# Patient Record
Sex: Male | Born: 1960
Health system: Southern US, Community
[De-identification: ages and names within clinical notes are randomized; demographics above are authoritative.]

## PROBLEM LIST (undated history)

## (undated) DIAGNOSIS — J302 Other seasonal allergic rhinitis: Secondary | ICD-10-CM

## (undated) DIAGNOSIS — K5190 Ulcerative colitis, unspecified, without complications: Secondary | ICD-10-CM

## (undated) DIAGNOSIS — J45990 Exercise induced bronchospasm: Secondary | ICD-10-CM

## (undated) DIAGNOSIS — Z8619 Personal history of other infectious and parasitic diseases: Secondary | ICD-10-CM

## (undated) DIAGNOSIS — Z87442 Personal history of urinary calculi: Secondary | ICD-10-CM

## (undated) HISTORY — DX: Exercise induced bronchospasm: J45.990

## (undated) HISTORY — DX: Other seasonal allergic rhinitis: J30.2

## (undated) HISTORY — DX: Ulcerative colitis, unspecified, without complications: K51.90

## (undated) HISTORY — PX: VASECTOMY: SHX75

## (undated) HISTORY — DX: Personal history of urinary calculi: Z87.442

## (undated) HISTORY — DX: Personal history of other infectious and parasitic diseases: Z86.19

---
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
## 1990-10-21 HISTORY — PX: PILONIDAL CYST DRAINAGE: SHX743

## 1994-10-21 DIAGNOSIS — Z87442 Personal history of urinary calculi: Secondary | ICD-10-CM

## 1994-10-21 HISTORY — DX: Personal history of urinary calculi: Z87.442

## 2008-10-21 HISTORY — PX: HERNIA REPAIR: SHX51

## 2013-12-26 DIAGNOSIS — E785 Hyperlipidemia, unspecified: Secondary | ICD-10-CM | POA: Insufficient documentation

## 2017-01-20 ENCOUNTER — Ambulatory Visit: Payer: Self-pay | Admitting: Family Medicine

## 2017-02-03 ENCOUNTER — Ambulatory Visit (INDEPENDENT_AMBULATORY_CARE_PROVIDER_SITE_OTHER): Payer: 59 | Admitting: Family Medicine

## 2017-02-03 ENCOUNTER — Encounter: Payer: Self-pay | Admitting: Family Medicine

## 2017-02-03 VITALS — BP 130/80 | HR 64 | Temp 98.2°F | Ht 68.5 in | Wt 182.5 lb

## 2017-02-03 DIAGNOSIS — E785 Hyperlipidemia, unspecified: Secondary | ICD-10-CM | POA: Diagnosis not present

## 2017-02-03 DIAGNOSIS — J45990 Exercise induced bronchospasm: Secondary | ICD-10-CM

## 2017-02-03 DIAGNOSIS — K5190 Ulcerative colitis, unspecified, without complications: Secondary | ICD-10-CM

## 2017-02-03 DIAGNOSIS — J302 Other seasonal allergic rhinitis: Secondary | ICD-10-CM | POA: Diagnosis not present

## 2017-02-03 LAB — LIPID PANEL
CHOL/HDL RATIO: 6
CHOLESTEROL: 216 mg/dL — AB (ref 0–200)
HDL: 39.2 mg/dL (ref >39.00)
LDL Cholesterol: 148 mg/dL — ABNORMAL HIGH (ref 0–99)
NonHDL: 176.99
TRIGLYCERIDES: 144 mg/dL (ref 0.0–149.0)
VLDL: 28.8 mg/dL (ref 0.0–40.0)

## 2017-02-03 LAB — COMPREHENSIVE METABOLIC PANEL
ALBUMIN: 4.2 g/dL (ref 3.5–5.2)
ALT: 43 U/L (ref 0–53)
AST: 26 U/L (ref 0–37)
Alkaline Phosphatase: 59 U/L (ref 39–117)
BILIRUBIN TOTAL: 0.7 mg/dL (ref 0.2–1.2)
BUN: 12 mg/dL (ref 6–23)
CALCIUM: 9.3 mg/dL (ref 8.4–10.5)
CHLORIDE: 105 meq/L (ref 96–112)
CO2: 30 mEq/L (ref 19–32)
CREATININE: 0.92 mg/dL (ref 0.40–1.50)
GFR: 90.84 mL/min (ref >60.00)
Glucose, Bld: 106 mg/dL — ABNORMAL HIGH (ref 70–99)
Potassium: 4.5 mEq/L (ref 3.5–5.1)
Sodium: 139 mEq/L (ref 135–145)
Total Protein: 7 g/dL (ref 6.0–8.3)

## 2017-02-03 LAB — CBC WITH DIFFERENTIAL/PLATELET
BASOS ABS: 0 10*3/uL (ref 0.0–0.1)
BASOS PCT: 0.4 % (ref 0.0–3.0)
Eosinophils Absolute: 0.1 10*3/uL (ref 0.0–0.7)
Eosinophils Relative: 2.2 % (ref 0.0–5.0)
HCT: 46.8 % (ref 39.0–52.0)
Hemoglobin: 15.8 g/dL (ref 13.0–17.0)
LYMPHS PCT: 25.8 % (ref 12.0–46.0)
Lymphs Abs: 1.2 10*3/uL (ref 0.7–4.0)
MCHC: 33.8 g/dL (ref 30.0–36.0)
MCV: 95.7 fl (ref 78.0–100.0)
MONO ABS: 0.4 10*3/uL (ref 0.1–1.0)
Monocytes Relative: 8.6 % (ref 3.0–12.0)
NEUTROS ABS: 2.8 10*3/uL (ref 1.4–7.7)
Neutrophils Relative %: 63 % (ref 43.0–77.0)
PLATELETS: 281 10*3/uL (ref 150.0–400.0)
RBC: 4.89 Mil/uL (ref 4.22–5.81)
RDW: 14 % (ref 11.5–15.5)
WBC: 4.5 10*3/uL (ref 4.0–10.5)

## 2017-02-03 MED ORDER — MESALAMINE 1.2 G PO TBEC: 1.2000 g | DELAYED_RELEASE_TABLET | Freq: Two times a day (BID) | ORAL | 3 refills | Status: DC

## 2017-02-03 MED ORDER — AZATHIOPRINE 50 MG PO TABS: 100.0000 mg | ORAL_TABLET | Freq: Every day | ORAL | 3 refills | Status: DC

## 2017-02-03 NOTE — Progress Notes (Signed)
BP 130/80   Pulse 64   Temp 98.2 F (36.8 C) (Oral)   Ht 5' 8.5" (1.74 m)   Wt 182 lb 8 oz (82.8 kg)   BMI 27.35 kg/m    CC: new pt to establish care Subjective:    Patient ID: Dustin Salazar, male    DOB: 11/27/1960, 56 y.o.   MRN: [id number]  HPI: Dustin Salazar is a 56 y.o. male presenting on 02/03/2017 for Establish Care   MD  Prior saw Dr Sela Hua at [facility] in [place].   Ulcerative colitis - on imuran and mesalamine. In remission since 1999. Followed by Dr Lupita Dawn [facility] in [facility]. Colonoscopy Q2 yrs. Requests labs today.  EIA - albuterol proair PRN.  Worsening hand arthritis pains - takes regular naprosyn.   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 Interim medical history since our last visit reviewed. Allergies and medications reviewed and updated. No outpatient prescriptions prior to visit.   No facility-administered medications prior to visit.      Per HPI unless specifically indicated in ROS section below Review of Systems     Objective:    BP 130/80   Pulse 64   Temp 98.2 F (36.8 C) (Oral)   Ht 5' 8.5" (1.74 m)   Wt 182 lb 8 oz (82.8 kg)   BMI 27.35 kg/m   Wt Readings from Last 3 Encounters:  02/03/17 182 lb 8 oz (82.8 kg)    Physical Exam  Constitutional: He appears well-developed and well-nourished. No distress.  HENT:  Mouth/Throat: Oropharynx is clear and moist. No oropharyngeal exudate.  Eyes:  Conjunctivae and EOM are normal. Pupils are equal, round, and reactive to light. No scleral icterus.  Neck: Normal range of motion. Neck supple. No thyromegaly present.  Cardiovascular: Normal rate, regular rhythm, normal heart sounds and intact distal pulses.   No murmur heard. Pulmonary/Chest: Effort normal and breath sounds normal. No respiratory distress. He has no wheezes. He has no rales.  Musculoskeletal: He exhibits no edema.  Lymphadenopathy:    He has no cervical adenopathy.  Skin: Skin is warm and dry. No rash noted.  Psychiatric: He has a normal mood and affect.  Nursing note and vitals reviewed.  No results found for this or any previous visit.    Assessment & Plan:   Problem List Items Addressed This Visit    Exercise-induced asthma    Controlled with PRN albuterol (proair)      Relevant Medications   albuterol (PROVENTIL HFA;VENTOLIN HFA) 108 (90 Base) MCG/ACT inhaler   Hyperlipidemia, unspecified    Chronic, update FLP. Not on medication.       Relevant Orders   Lipid panel   Seasonal allergic rhinitis    Reviewed zyrtec use.       Ulcerative colitis (Dustin Salazar) - Primary    Prior PCP managed medications, he sees GI Q2 yrs for colonoscopy. Planning on keeping GI in [place] at this time. In remission since imuran started. No concerns today.  Relevant Orders   Comprehensive metabolic panel   CBC with Differential/Platelet       Follow up plan: Return in about 6 months (around 08/05/2017) for annual exam, prior fasting for blood work.  Dustin Bush, MD

## 2017-02-03 NOTE — Assessment & Plan Note (Signed)
Chronic, update FLP. Not on medication.

## 2017-02-03 NOTE — Assessment & Plan Note (Signed)
Prior PCP managed medications, he sees GI Q2 yrs for colonoscopy. Planning on keeping GI in [place] at this time. In remission since imuran started. No concerns today.

## 2017-02-03 NOTE — Progress Notes (Signed)
Pre visit review using our clinic review tool, if applicable. No additional management support is needed unless otherwise documented below in the visit note. 

## 2017-02-03 NOTE — Assessment & Plan Note (Signed)
Controlled with PRN albuterol (proair)

## 2017-02-03 NOTE — Assessment & Plan Note (Signed)
Reviewed zyrtec use.

## 2017-02-03 NOTE — Patient Instructions (Addendum)
Labs today.  Medicines refilled today.  Good to meet you today, call us with questions.  Return as needed or in 6 months for physical.

## 2017-08-12 ENCOUNTER — Ambulatory Visit (INDEPENDENT_AMBULATORY_CARE_PROVIDER_SITE_OTHER): Payer: 59 | Admitting: Family Medicine

## 2017-08-12 ENCOUNTER — Encounter: Payer: Self-pay | Admitting: Family Medicine

## 2017-08-12 VITALS — BP 118/66 | HR 89 | Temp 98.2°F | Ht 68.0 in | Wt 171.0 lb

## 2017-08-12 DIAGNOSIS — E785 Hyperlipidemia, unspecified: Secondary | ICD-10-CM | POA: Diagnosis not present

## 2017-08-12 DIAGNOSIS — Z125 Encounter for screening for malignant neoplasm of prostate: Secondary | ICD-10-CM | POA: Diagnosis not present

## 2017-08-12 DIAGNOSIS — Z23 Encounter for immunization: Secondary | ICD-10-CM | POA: Diagnosis not present

## 2017-08-12 DIAGNOSIS — K5190 Ulcerative colitis, unspecified, without complications: Secondary | ICD-10-CM | POA: Diagnosis not present

## 2017-08-12 DIAGNOSIS — E041 Nontoxic single thyroid nodule: Secondary | ICD-10-CM | POA: Diagnosis not present

## 2017-08-12 DIAGNOSIS — Z0000 Encounter for general adult medical examination without abnormal findings: Secondary | ICD-10-CM | POA: Diagnosis not present

## 2017-08-12 LAB — CBC WITH DIFFERENTIAL/PLATELET
Basophils Absolute: 0 10*3/uL (ref 0.0–0.1)
Basophils Relative: 0.5 % (ref 0.0–3.0)
EOS ABS: 0.1 10*3/uL (ref 0.0–0.7)
Eosinophils Relative: 1 % (ref 0.0–5.0)
HCT: 48.8 % (ref 39.0–52.0)
HEMOGLOBIN: 16.5 g/dL (ref 13.0–17.0)
Lymphocytes Relative: 19.3 % (ref 12.0–46.0)
Lymphs Abs: 1.3 10*3/uL (ref 0.7–4.0)
MCHC: 33.9 g/dL (ref 30.0–36.0)
MCV: 96.1 fl (ref 78.0–100.0)
MONO ABS: 0.6 10*3/uL (ref 0.1–1.0)
Monocytes Relative: 8.1 % (ref 3.0–12.0)
Neutro Abs: 4.8 10*3/uL (ref 1.4–7.7)
Neutrophils Relative %: 71.1 % (ref 43.0–77.0)
Platelets: 340 10*3/uL (ref 150.0–400.0)
RBC: 5.08 Mil/uL (ref 4.22–5.81)
RDW: 13.3 % (ref 11.5–15.5)
WBC: 6.8 10*3/uL (ref 4.0–10.5)

## 2017-08-12 LAB — TSH: TSH: 1.01 u[IU]/mL (ref 0.35–4.50)

## 2017-08-12 LAB — COMPREHENSIVE METABOLIC PANEL
ALBUMIN: 4.7 g/dL (ref 3.5–5.2)
ALT: 45 U/L (ref 0–53)
AST: 31 U/L (ref 0–37)
Alkaline Phosphatase: 93 U/L (ref 39–117)
BUN: 15 mg/dL (ref 6–23)
CHLORIDE: 101 meq/L (ref 96–112)
CO2: 31 meq/L (ref 19–32)
Calcium: 9.9 mg/dL (ref 8.4–10.5)
Creatinine, Ser: 1.04 mg/dL (ref 0.40–1.50)
GFR: 78.71 mL/min (ref >60.00)
Glucose, Bld: 103 mg/dL — ABNORMAL HIGH (ref 70–99)
POTASSIUM: 5.1 meq/L (ref 3.5–5.1)
SODIUM: 138 meq/L (ref 135–145)
Total Bilirubin: 0.7 mg/dL (ref 0.2–1.2)
Total Protein: 7.5 g/dL (ref 6.0–8.3)

## 2017-08-12 LAB — LIPID PANEL
CHOL/HDL RATIO: 5
CHOLESTEROL: 209 mg/dL — AB (ref 0–200)
HDL: 43.8 mg/dL (ref >39.00)
LDL CALC: 148 mg/dL — AB (ref 0–99)
NonHDL: 165.15
Triglycerides: 85 mg/dL (ref 0.0–149.0)
VLDL: 17 mg/dL (ref 0.0–40.0)

## 2017-08-12 LAB — PSA: PSA: 0.37 ng/mL (ref 0.10–4.00)

## 2017-08-12 NOTE — Assessment & Plan Note (Signed)
R sided neck nodule palpated today. A bit high for a thyroid nodule - will check thyroid US today. No fmhx or personal history of thyroid disease.

## 2017-08-12 NOTE — Assessment & Plan Note (Signed)
Preventative protocols reviewed and updated unless pt declined. Discussed healthy diet and lifestyle.  

## 2017-08-12 NOTE — Progress Notes (Signed)
BP 118/66 (BP Location: Right Arm, Patient Position: Sitting, Cuff Size: Normal)   Pulse 89   Temp 98.2 F (36.8 C) (Oral)   Ht 5\' 8"  (1.727 m)   Wt 171 lb (77.6 kg)   SpO2 98%   BMI 26.00 kg/m    CC: CPE Subjective:    Patient ID: Dustin Salazar, male    DOB: 03/28/1961, 56 y.o.   MRN: [id number]  HPI: Clemons Salvucci is a 56 y.o. male presenting on 08/12/2017 for Annual Exam (same day labs)   IBD - controlled on imuran and lialda.  Regularly takes aleve BID for hand arthralgias (MCPs and PIPs diffusely).   Preventative: Colonoscopy 2017, Q2 yrs ([facility] at [facility]) Prostate cancer screening - discussed. Agrees to PSA, declines DRE.  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 Interim medical history since our last visit reviewed. Allergies and medications reviewed and updated. Outpatient Medications Prior to Visit  Medication Sig Dispense Refill  . albuterol (PROVENTIL HFA;VENTOLIN HFA) 108 (90 Base) MCG/ACT inhaler Inhale 1-2 puffs into the lungs as needed for wheezing or shortness of breath (Uses with exercise/running).    Marland Kitchen azaTHIOprine (IMURAN) 50 MG tablet Take 2 tablets (100 mg total) by mouth daily. 180 tablet 3  . cetirizine (ZYRTEC) 10 MG tablet Take 10 mg by mouth daily.    . Cholecalciferol (VITAMIN D3) 2000 units TABS Take 2,000 Units by mouth daily.    . mesalamine (LIALDA) 1.2 g EC tablet Take 1 tablet (1.2 g total) by mouth 2 (two) times daily. 180 tablet 3  . Naproxen Sodium 220 MG  CAPS Take 1 capsule by mouth 2 (two) times daily.     No facility-administered medications prior to visit.      Per HPI unless specifically indicated in ROS section below Review of Systems  Constitutional: Negative for activity change, appetite change, chills, fatigue, fever and unexpected weight change.  HENT: Negative for hearing loss.   Eyes: Negative for visual disturbance.  Respiratory: Negative for cough, chest tightness, shortness of breath and wheezing.   Cardiovascular: Negative for chest pain, palpitations and leg swelling.  Gastrointestinal: Negative for abdominal distention, abdominal pain, blood in stool, constipation, diarrhea, nausea and vomiting.  Genitourinary: Negative for difficulty urinating and hematuria.  Musculoskeletal: Negative for arthralgias, myalgias and neck pain.  Skin: Negative for rash.  Neurological: Negative for dizziness, seizures, syncope and headaches.  Hematological: Negative for adenopathy. Does not bruise/bleed easily.  Psychiatric/Behavioral: Negative for dysphoric mood. The patient is not nervous/anxious.        Objective:    BP 118/66 (BP Location: Right Arm, Patient Position: Sitting, Cuff Size: Normal)   Pulse 89   Temp 98.2 F (36.8 C) (Oral)   Ht 5\' 8"  (1.727 m)   Wt 171 lb (77.6 kg)   SpO2 98%   BMI 26.00 kg/m   Wt Readings from Last 3 Encounters:  08/12/17 171 lb (77.6 kg)  02/03/17 182 lb 8 oz (82.8 kg)    Physical Exam  Constitutional: He is oriented to person, place, and time. He  appears well-developed and well-nourished. No distress.  HENT:  Head: Normocephalic and atraumatic.  Right Ear: Hearing, tympanic membrane, external ear and ear canal normal.  Left Ear: Hearing, tympanic membrane, external ear and ear canal normal.  Nose: Nose normal.  Mouth/Throat: Uvula is midline, oropharynx is clear and moist and mucous membranes are normal. No oropharyngeal exudate, posterior oropharyngeal edema or posterior oropharyngeal  erythema.  Eyes: Pupils are equal, round, and reactive to light. Conjunctivae and EOM are normal. No scleral icterus.  Neck: Normal range of motion. Neck supple. No thyromegaly (R thyroid nodule) present.  Cardiovascular: Normal rate, regular rhythm, normal heart sounds and intact distal pulses.   No murmur heard. Pulses:      Radial pulses are 2+ on the right side, and 2+ on the left side.  Pulmonary/Chest: Effort normal and breath sounds normal. No respiratory distress. He has no wheezes. He has no rales.  Abdominal: Soft. Bowel sounds are normal. He exhibits no distension and no mass. There is no tenderness. There is no rebound and no guarding.  Musculoskeletal: Normal range of motion. He exhibits no edema.  Lymphadenopathy:    He has no cervical adenopathy.  Neurological: He is alert and oriented to person, place, and time.  CN grossly intact, station and gait intact  Skin: Skin is warm and dry. No rash noted.  Psychiatric: He has a normal mood and affect. His behavior is normal. Judgment and thought content normal.  Nursing note and vitals reviewed.  Results for orders placed or performed in visit on 02/03/17  Lipid panel  Result Value Ref Range   Cholesterol 216 (H) 0 - 200 mg/dL   Triglycerides 144.0 0.0 - 149.0 mg/dL   HDL 39.20 >39.00 mg/dL   VLDL 28.8 0.0 - 40.0 mg/dL   LDL Cholesterol 148 (H) 0 - 99 mg/dL   Total CHOL/HDL Ratio 6    NonHDL 176.99   Comprehensive metabolic panel  Result Value Ref Range   Sodium 139 135 - 145 mEq/L   Potassium 4.5 3.5 - 5.1 mEq/L   Chloride 105 96 - 112 mEq/L   CO2 30 19 - 32 mEq/L   Glucose, Bld 106 (H) 70 - 99 mg/dL   BUN 12 6 - 23 mg/dL   Creatinine, Ser 0.92 0.40 - 1.50 mg/dL   Total Bilirubin 0.7 0.2 - 1.2 mg/dL   Alkaline Phosphatase 59 39 - 117 U/L   AST 26 0 - 37 U/L   ALT 43 0 - 53 U/L   Total Protein 7.0 6.0 - 8.3 g/dL   Albumin 4.2 3.5 - 5.2 g/dL   Calcium 9.3 8.4 - 10.5 mg/dL   GFR 90.84 >60.00 mL/min  CBC with  Differential/Platelet  Result Value Ref Range   WBC 4.5 4.0 - 10.5 K/uL   RBC 4.89 4.22 - 5.81 Mil/uL   Hemoglobin 15.8 13.0 - 17.0 g/dL   HCT 46.8 39.0 - 52.0 %   MCV 95.7 78.0 - 100.0 fl   MCHC 33.8 30.0 - 36.0 g/dL   RDW 14.0 11.5 - 15.5 %   Platelets 281.0 150.0 - 400.0 K/uL   Neutrophils Relative % 63.0 43.0 - 77.0 %   Lymphocytes Relative 25.8 12.0 - 46.0 %   Monocytes Relative 8.6 3.0 - 12.0 %   Eosinophils Relative 2.2 0.0 - 5.0 %   Basophils Relative 0.4 0.0 - 3.0 %   Neutro Abs 2.8 1.4 - 7.7 K/uL   Lymphs Abs 1.2 0.7 - 4.0 K/uL  Monocytes Absolute 0.4 0.1 - 1.0 K/uL   Eosinophils Absolute 0.1 0.0 - 0.7 K/uL   Basophils Absolute 0.0 0.0 - 0.1 K/uL      Assessment & Plan:   Problem List Items Addressed This Visit    Health maintenance examination - Primary    Preventative protocols reviewed and updated unless pt declined. Discussed healthy diet and lifestyle.       Hyperlipidemia, unspecified    Chronic off meds. Encouraged low chol diet and mediterranean diet given fmhx.  The 10-year ASCVD risk score Mikey Bussing DC Brooke Bonito., et al., 2013) is: 6.6%   Values used to calculate the score:     Age: 84 years     Sex: Male     Is Non-Hispanic African American: No     Diabetic: No     Tobacco smoker: No     Systolic Blood Pressure: 454 mmHg     Is BP treated: No     HDL Cholesterol: 39.2 mg/dL     Total Cholesterol: 216 mg/dL       Relevant Orders   Lipid panel   Comprehensive metabolic panel   Thyroid nodule    R sided neck nodule palpated today. A bit high for a thyroid nodule - will check thyroid US today. No fmhx or personal history of thyroid disease.      Relevant Orders   TSH   US THYROID   Ulcerative colitis ([place])    Chronic, stable on current regimen - continue.  Sees GI Q2 yrs - in [place]. Due for colonoscopy 2019.  Will come here for labs Q6 months.       Relevant Orders   CBC with Differential/Platelet    Other Visit Diagnoses    Need for influenza  vaccination       Relevant Orders   Flu Vaccine QUAD 6+ mos PF IM (Fluarix Quad PF)   Special screening for malignant neoplasm of prostate       Relevant Orders   PSA       Follow up plan: Return in about 6 months (around 02/10/2018) for annual exam, prior fasting for blood work.  Ria Bush, MD

## 2017-08-12 NOTE — Assessment & Plan Note (Addendum)
Chronic, stable on current regimen - continue.  Sees GI Q2 yrs - in [place]. Due for colonoscopy 2019.  Will come here for labs Q6 months.

## 2017-08-12 NOTE — Assessment & Plan Note (Signed)
Chronic off meds. Encouraged low chol diet and mediterranean diet given fmhx.  The 10-year ASCVD risk score Mikey Bussing DC Brooke Bonito., et al., 2013) is: 6.6%   Values used to calculate the score:     Age: 56 years     Sex: Male     Is Non-Hispanic African American: No     Diabetic: No     Tobacco smoker: No     Systolic Blood Pressure: 295 mmHg     Is BP treated: No     HDL Cholesterol: 39.2 mg/dL     Total Cholesterol: 216 mg/dL

## 2017-08-12 NOTE — Patient Instructions (Addendum)
Flu shot today.  Blood work today.  See Rosaria Ferries to schedule thyroid ultrasound.  You are doing well today - continue working on low cholesterol diet. Given family history, I recommend mediterranean diet.  Return in 6 months for lab visit only, then in 1 year for next physical.   Health Maintenance, Male A healthy lifestyle and preventive care is important for your health and wellness. Ask your health care provider about what schedule of regular examinations is right for you. What should I know about weight and diet? Eat a Healthy Diet  Eat plenty of vegetables, fruits, whole grains, low-fat dairy products, and lean protein.  Do not eat a lot of foods high in solid fats, added sugars, or salt.  Maintain a Healthy Weight Regular exercise can help you achieve or maintain a healthy weight. You should:  Do at least 150 minutes of exercise each week. The exercise should increase your heart rate and make you sweat (moderate-intensity exercise).  Do strength-training exercises at least twice a week.  Watch Your Levels of Cholesterol and Blood Lipids  Have your blood tested for lipids and cholesterol every 5 years starting at 55 years of age. If you are at high risk for heart disease, you should start having your blood tested when you are 55 years old. You may need to have your cholesterol levels checked more often if: ? Your lipid or cholesterol levels are high. ? You are older than 55 years of age. ? You are at high risk for heart disease.  What should I know about cancer screening? Many types of cancers can be detected early and may often be prevented. Lung Cancer  You should be screened every year for lung cancer if: ? You are a current smoker who has smoked for at least 30 years. ? You are a former smoker who has quit within the past 15 years.  Talk to your health care provider about your screening options, when you should start screening, and how often you should be  screened.  Colorectal Cancer  Routine colorectal cancer screening usually begins at 55 years of age and should be repeated every 5-10 years until you are 55 years old. You may need to be screened more often if early forms of precancerous polyps or small growths are found. Your health care provider may recommend screening at an earlier age if you have risk factors for colon cancer.  Your health care provider may recommend using home test kits to check for hidden blood in the stool.  A small camera at the end of a tube can be used to examine your colon (sigmoidoscopy or colonoscopy). This checks for the earliest forms of colorectal cancer.  Prostate and Testicular Cancer  Depending on your age and overall health, your health care provider may do certain tests to screen for prostate and testicular cancer.  Talk to your health care provider about any symptoms or concerns you have about testicular or prostate cancer.  Skin Cancer  Check your skin from head to toe regularly.  Tell your health care provider about any new moles or changes in moles, especially if: ? There is a change in a mole's size, shape, or color. ? You have a mole that is larger than a pencil eraser.  Always use sunscreen. Apply sunscreen liberally and repeat throughout the day.  Protect yourself by wearing long sleeves, pants, a wide-brimmed hat, and sunglasses when outside.  What should I know about heart disease, diabetes, and high blood  pressure?  If you are 86-17 years of age, have your blood pressure checked every 3-5 years. If you are 64 years of age or older, have your blood pressure checked every year. You should have your blood pressure measured twice-once when you are at a hospital or clinic, and once when you are not at a hospital or clinic. Record the average of the two measurements. To check your blood pressure when you are not at a hospital or clinic, you can use: ? An automated blood pressure machine at a  pharmacy. ? A home blood pressure monitor.  Talk to your health care provider about your target blood pressure.  If you are between 21-33 years old, ask your health care provider if you should take aspirin to prevent heart disease.  Have regular diabetes screenings by checking your fasting blood sugar level. ? If you are at a normal weight and have a low risk for diabetes, have this test once every three years after the age of 12. ? If you are overweight and have a high risk for diabetes, consider being tested at a younger age or more often.  A one-time screening for abdominal aortic aneurysm (AAA) by ultrasound is recommended for men aged 70-75 years who are current or former smokers. What should I know about preventing infection? Hepatitis B If you have a higher risk for hepatitis B, you should be screened for this virus. Talk with your health care provider to find out if you are at risk for hepatitis B infection. Hepatitis C Blood testing is recommended for:  Everyone born from [birth year] through 1964.  Anyone with known risk factors for hepatitis C.  Sexually Transmitted Diseases (STDs)  You should be screened each year for STDs including gonorrhea and chlamydia if: ? You are sexually active and are younger than 55 years of age. ? You are older than 55 years of age and your health care provider tells you that you are at risk for this type of infection. ? Your sexual activity has changed since you were last screened and you are at an increased risk for chlamydia or gonorrhea. Ask your health care provider if you are at risk.  Talk with your health care provider about whether you are at high risk of being infected with HIV. Your health care provider may recommend a prescription medicine to help prevent HIV infection.  What else can I do?  Schedule regular health, dental, and eye exams.  Stay current with your vaccines (immunizations).  Do not use any tobacco products, such as  cigarettes, chewing tobacco, and e-cigarettes. If you need help quitting, ask your health care provider.  Limit alcohol intake to no more than 2 drinks per day. One drink equals 12 ounces of beer, 5 ounces of wine, or 1 ounces of hard liquor.  Do not use street drugs.  Do not share needles.  Ask your health care provider for help if you need support or information about quitting drugs.  Tell your health care provider if you often feel depressed.  Tell your health care provider if you have ever been abused or do not feel safe at home. This information is not intended to replace advice given to you by your health care provider. Make sure you discuss any questions you have with your health care provider. Document Released: 04/04/2008 Document Revised: 06/05/2016 Document Reviewed: 07/11/2015 Elsevier Interactive Patient Education  [employer].

## 2017-08-18 ENCOUNTER — Ambulatory Visit
Admission: RE | Admit: 2017-08-18 | Discharge: 2017-08-18 | Disposition: A | Discharge status: Home / Self Care | Payer: 59 | Source: Ambulatory Visit | Attending: Family Medicine | Admitting: Family Medicine

## 2017-08-18 DIAGNOSIS — E049 Nontoxic goiter, unspecified: Secondary | ICD-10-CM | POA: Diagnosis not present

## 2017-08-18 DIAGNOSIS — E041 Nontoxic single thyroid nodule: Secondary | ICD-10-CM

## 2017-11-04 DIAGNOSIS — Z0100 Encounter for examination of eyes and vision without abnormal findings: Secondary | ICD-10-CM | POA: Diagnosis not present

## 2018-01-19 ENCOUNTER — Encounter: Payer: Self-pay | Admitting: Family Medicine

## 2018-01-19 DIAGNOSIS — K648 Other hemorrhoids: Secondary | ICD-10-CM | POA: Diagnosis not present

## 2018-01-19 DIAGNOSIS — Z1211 Encounter for screening for malignant neoplasm of colon: Secondary | ICD-10-CM | POA: Diagnosis not present

## 2018-01-19 DIAGNOSIS — K635 Polyp of colon: Secondary | ICD-10-CM | POA: Diagnosis not present

## 2018-01-19 DIAGNOSIS — K5100 Ulcerative (chronic) pancolitis without complications: Secondary | ICD-10-CM | POA: Diagnosis not present

## 2018-01-19 DIAGNOSIS — D120 Benign neoplasm of cecum: Secondary | ICD-10-CM | POA: Diagnosis not present

## 2018-01-19 DIAGNOSIS — K529 Noninfective gastroenteritis and colitis, unspecified: Secondary | ICD-10-CM | POA: Diagnosis not present

## 2018-01-19 HISTORY — PX: COLONOSCOPY: SHX174

## 2018-01-19 LAB — HM COLONOSCOPY

## 2018-01-23 ENCOUNTER — Other Ambulatory Visit: Payer: Self-pay | Admitting: Family Medicine

## 2018-01-24 ENCOUNTER — Encounter: Payer: Self-pay | Admitting: Family Medicine

## 2018-01-26 NOTE — Telephone Encounter (Signed)
Last filled:  11/11/17, #180 Last OV (CPE):  08/12/17 Next OV (CPE):  08/14/18

## 2018-02-03 ENCOUNTER — Other Ambulatory Visit: Payer: Self-pay | Admitting: Family Medicine

## 2018-02-03 ENCOUNTER — Other Ambulatory Visit: Payer: 59

## 2018-02-03 DIAGNOSIS — E041 Nontoxic single thyroid nodule: Secondary | ICD-10-CM

## 2018-02-03 DIAGNOSIS — E782 Mixed hyperlipidemia: Secondary | ICD-10-CM

## 2018-02-03 DIAGNOSIS — Z1159 Encounter for screening for other viral diseases: Secondary | ICD-10-CM

## 2018-02-03 DIAGNOSIS — K5190 Ulcerative colitis, unspecified, without complications: Secondary | ICD-10-CM

## 2018-02-10 ENCOUNTER — Other Ambulatory Visit: Payer: 59

## 2018-02-24 ENCOUNTER — Other Ambulatory Visit (INDEPENDENT_AMBULATORY_CARE_PROVIDER_SITE_OTHER): Payer: 59

## 2018-02-24 DIAGNOSIS — E041 Nontoxic single thyroid nodule: Secondary | ICD-10-CM | POA: Diagnosis not present

## 2018-02-24 DIAGNOSIS — K5190 Ulcerative colitis, unspecified, without complications: Secondary | ICD-10-CM | POA: Diagnosis not present

## 2018-02-24 DIAGNOSIS — E782 Mixed hyperlipidemia: Secondary | ICD-10-CM

## 2018-02-24 DIAGNOSIS — Z1159 Encounter for screening for other viral diseases: Secondary | ICD-10-CM

## 2018-02-24 LAB — CBC WITH DIFFERENTIAL/PLATELET
BASOS ABS: 0 10*3/uL (ref 0.0–0.1)
Basophils Relative: 0.5 % (ref 0.0–3.0)
EOS ABS: 0.1 10*3/uL (ref 0.0–0.7)
Eosinophils Relative: 2.3 % (ref 0.0–5.0)
HEMATOCRIT: 45.2 % (ref 39.0–52.0)
Hemoglobin: 15.6 g/dL (ref 13.0–17.0)
LYMPHS PCT: 28.8 % (ref 12.0–46.0)
Lymphs Abs: 1.2 10*3/uL (ref 0.7–4.0)
MCHC: 34.4 g/dL (ref 30.0–36.0)
MCV: 95.2 fl (ref 78.0–100.0)
MONOS PCT: 10.8 % (ref 3.0–12.0)
Monocytes Absolute: 0.5 10*3/uL (ref 0.1–1.0)
Neutro Abs: 2.4 10*3/uL (ref 1.4–7.7)
Neutrophils Relative %: 57.6 % (ref 43.0–77.0)
Platelets: 280 10*3/uL (ref 150.0–400.0)
RBC: 4.75 Mil/uL (ref 4.22–5.81)
RDW: 14 % (ref 11.5–15.5)
WBC: 4.2 10*3/uL (ref 4.0–10.5)

## 2018-02-24 LAB — LIPID PANEL
CHOL/HDL RATIO: 6
Cholesterol: 228 mg/dL — ABNORMAL HIGH (ref 0–200)
HDL: 40.6 mg/dL (ref >39.00)
LDL Cholesterol: 167 mg/dL — ABNORMAL HIGH (ref 0–99)
NONHDL: 187.59
TRIGLYCERIDES: 103 mg/dL (ref 0.0–149.0)
VLDL: 20.6 mg/dL (ref 0.0–40.0)

## 2018-02-24 LAB — COMPREHENSIVE METABOLIC PANEL
ALK PHOS: 53 U/L (ref 39–117)
ALT: 20 U/L (ref 0–53)
AST: 17 U/L (ref 0–37)
Albumin: 4.3 g/dL (ref 3.5–5.2)
BUN: 12 mg/dL (ref 6–23)
CALCIUM: 9.3 mg/dL (ref 8.4–10.5)
CO2: 30 meq/L (ref 19–32)
Chloride: 102 mEq/L (ref 96–112)
Creatinine, Ser: 0.98 mg/dL (ref 0.40–1.50)
GFR: 84.13 mL/min (ref >60.00)
GLUCOSE: 95 mg/dL (ref 70–99)
POTASSIUM: 4.3 meq/L (ref 3.5–5.1)
Sodium: 138 mEq/L (ref 135–145)
Total Bilirubin: 1.2 mg/dL (ref 0.2–1.2)
Total Protein: 7.4 g/dL (ref 6.0–8.3)

## 2018-02-24 LAB — TSH: TSH: 1.38 u[IU]/mL (ref 0.35–4.50)

## 2018-02-25 LAB — HEPATITIS C ANTIBODY
HEP C AB: NONREACTIVE
SIGNAL TO CUT-OFF: 0.01 (ref <1.00)

## 2018-03-03 ENCOUNTER — Other Ambulatory Visit: Payer: Self-pay | Admitting: Family Medicine

## 2018-03-05 ENCOUNTER — Encounter: Payer: Self-pay | Admitting: Family Medicine

## 2018-03-05 NOTE — Telephone Encounter (Signed)
Spoke with Maudie Mercury in [place] with [facility] and was told a PA was not needed for this med.   Spoke with Randa Evens (spelling ?) in customer service to get clarification on rx.  Says pt was not told rx needed PA but that it was too soon to process refill.  However, Gisinia check status of rx and says it can be processed now.  So she proceeded to put it through for the pt.  I will inform pt via MyChart.

## 2018-08-06 ENCOUNTER — Other Ambulatory Visit: Payer: Self-pay | Admitting: Family Medicine

## 2018-08-06 NOTE — Telephone Encounter (Signed)
Lialda Last filled:  05/21/18, #180 Last OV:  08/12/17, CPE Next OV:  08/14/18, CPE

## 2018-08-09 ENCOUNTER — Other Ambulatory Visit: Payer: Self-pay | Admitting: Family Medicine

## 2018-08-09 DIAGNOSIS — K5190 Ulcerative colitis, unspecified, without complications: Secondary | ICD-10-CM

## 2018-08-09 DIAGNOSIS — Z125 Encounter for screening for malignant neoplasm of prostate: Secondary | ICD-10-CM

## 2018-08-09 DIAGNOSIS — E785 Hyperlipidemia, unspecified: Secondary | ICD-10-CM

## 2018-08-10 ENCOUNTER — Other Ambulatory Visit (INDEPENDENT_AMBULATORY_CARE_PROVIDER_SITE_OTHER): Payer: 59

## 2018-08-10 DIAGNOSIS — Z125 Encounter for screening for malignant neoplasm of prostate: Secondary | ICD-10-CM | POA: Diagnosis not present

## 2018-08-10 DIAGNOSIS — E785 Hyperlipidemia, unspecified: Secondary | ICD-10-CM

## 2018-08-10 DIAGNOSIS — K5190 Ulcerative colitis, unspecified, without complications: Secondary | ICD-10-CM | POA: Diagnosis not present

## 2018-08-10 LAB — COMPREHENSIVE METABOLIC PANEL
ALBUMIN: 4.4 g/dL (ref 3.5–5.2)
ALK PHOS: 67 U/L (ref 39–117)
ALT: 29 U/L (ref 0–53)
AST: 18 U/L (ref 0–37)
BILIRUBIN TOTAL: 0.9 mg/dL (ref 0.2–1.2)
BUN: 15 mg/dL (ref 6–23)
CO2: 29 mEq/L (ref 19–32)
Calcium: 9.4 mg/dL (ref 8.4–10.5)
Chloride: 102 mEq/L (ref 96–112)
Creatinine, Ser: 1.04 mg/dL (ref 0.40–1.50)
GFR: 78.42 mL/min (ref >60.00)
GLUCOSE: 109 mg/dL — AB (ref 70–99)
POTASSIUM: 4.6 meq/L (ref 3.5–5.1)
SODIUM: 138 meq/L (ref 135–145)
TOTAL PROTEIN: 7 g/dL (ref 6.0–8.3)

## 2018-08-10 LAB — LIPID PANEL
CHOL/HDL RATIO: 5
CHOLESTEROL: 225 mg/dL — AB (ref 0–200)
HDL: 41.4 mg/dL (ref >39.00)
LDL CALC: 159 mg/dL — AB (ref 0–99)
NonHDL: 183.96
Triglycerides: 127 mg/dL (ref 0.0–149.0)
VLDL: 25.4 mg/dL (ref 0.0–40.0)

## 2018-08-10 LAB — CBC WITH DIFFERENTIAL/PLATELET
BASOS PCT: 0.5 % (ref 0.0–3.0)
Basophils Absolute: 0 10*3/uL (ref 0.0–0.1)
EOS PCT: 2.8 % (ref 0.0–5.0)
Eosinophils Absolute: 0.1 10*3/uL (ref 0.0–0.7)
HEMATOCRIT: 46.8 % (ref 39.0–52.0)
HEMOGLOBIN: 15.9 g/dL (ref 13.0–17.0)
LYMPHS PCT: 31.7 % (ref 12.0–46.0)
Lymphs Abs: 1.4 10*3/uL (ref 0.7–4.0)
MCHC: 34.1 g/dL (ref 30.0–36.0)
MCV: 96 fl (ref 78.0–100.0)
MONO ABS: 0.4 10*3/uL (ref 0.1–1.0)
MONOS PCT: 9.1 % (ref 3.0–12.0)
Neutro Abs: 2.5 10*3/uL (ref 1.4–7.7)
Neutrophils Relative %: 55.9 % (ref 43.0–77.0)
Platelets: 285 10*3/uL (ref 150.0–400.0)
RBC: 4.87 Mil/uL (ref 4.22–5.81)
RDW: 14 % (ref 11.5–15.5)
WBC: 4.4 10*3/uL (ref 4.0–10.5)

## 2018-08-10 LAB — PSA: PSA: 0.42 ng/mL (ref 0.10–4.00)

## 2018-08-13 ENCOUNTER — Encounter: Payer: Self-pay | Admitting: Family Medicine

## 2018-08-13 NOTE — Assessment & Plan Note (Signed)
Preventative protocols reviewed and updated unless pt declined. Discussed healthy diet and lifestyle.  

## 2018-08-13 NOTE — Progress Notes (Signed)
BP 134/64 (BP Location: Left Arm, Patient Position: Sitting, Cuff Size: Normal)   Pulse 73   Temp 98 F (36.7 C) (Oral)   Ht 5\' 8"  (1.727 m)   Wt 173 lb (78.5 kg)   SpO2 97%   BMI 26.30 kg/m    CC: CPE Subjective:    Patient ID: Dustin Salazar, male    DOB: 1960-12-02, 57 y.o.   MRN: [id number]  HPI: Dustin Salazar is a 56 y.o. male presenting on 08/14/2018 for Annual Exam   IBD - controlled on imuran and lialda.  Regularly takes aleve BID for hand arthralgias (MCPs and PIPs diffusely).   Preventative: COLONOSCOPY 01/2018 - SSA, rpt 2 yrs (Dr Julien Nordmann in [place]) Prostate cancer screening - discussed. Agrees to PSA, declines DRE. States GI checks DRE.  Flu shot yearly Tetanus 2016  Seat belt use discussed  Sunscreen use discussed, no changing moles on skin.  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 Interim medical history since our last visit reviewed. Allergies and medications reviewed and updated. Outpatient Medications Prior to Visit  Medication Sig Dispense Refill  . albuterol (PROVENTIL HFA;VENTOLIN HFA) 108 (90 Base) MCG/ACT inhaler Inhale 1-2 puffs into the lungs as needed for wheezing or shortness of breath (Uses with exercise/running).    Marland Kitchen azaTHIOprine (IMURAN) 50 MG tablet TAKE 2 TABLETS BY MOUTH  DAILY 180 tablet 3  . cetirizine (ZYRTEC) 10 MG tablet Take 10 mg by mouth daily.    . Cholecalciferol (VITAMIN D3) 2000 units TABS Take 2,000 Units by mouth daily.    Marland Kitchen LIALDA 1.2 g EC tablet TAKE 1 TABLET BY MOUTH TWO  TIMES DAILY 180 tablet 1  . Naproxen Sodium 220 MG CAPS  Take 1 capsule by mouth 2 (two) times daily.     No facility-administered medications prior to visit.      Per HPI unless specifically indicated in ROS section below Review of Systems  Constitutional: Negative for activity change, appetite change, chills, fatigue, fever and unexpected weight change.  HENT: Positive for congestion (recent cold). Negative for hearing loss.   Eyes: Negative for visual disturbance.  Respiratory: Positive for cough. Negative for chest tightness, shortness of breath and wheezing.   Cardiovascular: Negative for chest pain, palpitations and leg swelling.  Gastrointestinal: Negative for abdominal distention, abdominal pain, blood in stool, constipation, diarrhea, nausea and vomiting.  Genitourinary: Negative for difficulty urinating and hematuria.  Musculoskeletal: Negative for arthralgias, myalgias and neck pain.  Skin: Negative for rash.  Neurological: Negative for dizziness, seizures, syncope and headaches.  Hematological: Negative for adenopathy. Does not bruise/bleed easily.  Psychiatric/Behavioral: Negative for dysphoric mood. The patient is not nervous/anxious.        Objective:    BP 134/64 (BP Location: Left Arm, Patient Position: Sitting, Cuff Size: Normal)   Pulse 73   Temp 98 F (36.7 C) (Oral)   Ht 5\' 8"  (1.727 m)   Wt 173 lb (78.5 kg)   SpO2 97%   BMI 26.30 kg/m   Wt Readings from Last 3 Encounters:  08/14/18 173 lb (78.5 kg)  08/12/17 171 lb (77.6 kg)  02/03/17 182 lb 8  oz (82.8 kg)    Physical Exam  Constitutional: He is oriented to person, place, and time. He appears well-developed and well-nourished. No distress.  HENT:  Head: Normocephalic and atraumatic.  Right Ear: Hearing, tympanic membrane, external ear and ear canal normal.  Left Ear: Hearing, tympanic membrane, external ear and ear canal normal.  Nose: Nose normal.  Mouth/Throat: Uvula is midline, oropharynx is clear and moist and mucous membranes are normal. No  oropharyngeal exudate, posterior oropharyngeal edema or posterior oropharyngeal erythema.  Eyes: Pupils are equal, round, and reactive to light. Conjunctivae and EOM are normal. No scleral icterus.  Neck: Normal range of motion. Neck supple. No thyromegaly (mild R thyroid fullness - s/p normal ultrasound last year) present.  Cardiovascular: Normal rate, regular rhythm, normal heart sounds and intact distal pulses.  No murmur heard. Pulses:      Radial pulses are 2+ on the right side, and 2+ on the left side.  Pulmonary/Chest: Effort normal and breath sounds normal. No respiratory distress. He has no wheezes. He has no rales.  Abdominal: Soft. Bowel sounds are normal. He exhibits no distension and no mass. There is no tenderness. There is no rebound and no guarding.  Musculoskeletal: Normal range of motion. He exhibits no edema.  Lymphadenopathy:    He has no cervical adenopathy.  Neurological: He is alert and oriented to person, place, and time.  CN grossly intact, station and gait intact  Skin: Skin is warm and dry. No rash noted.  Psychiatric: He has a normal mood and affect. His behavior is normal. Judgment and thought content normal.  Nursing note and vitals reviewed.  Results for orders placed or performed in visit on 08/10/18  PSA  Result Value Ref Range   PSA 0.42 0.10 - 4.00 ng/mL  CBC with Differential/Platelet  Result Value Ref Range   WBC 4.4 4.0 - 10.5 K/uL   RBC 4.87 4.22 - 5.81 Mil/uL   Hemoglobin 15.9 13.0 - 17.0 g/dL   HCT 46.8 39.0 - 52.0 %   MCV 96.0 78.0 - 100.0 fl   MCHC 34.1 30.0 - 36.0 g/dL   RDW 14.0 11.5 - 15.5 %   Platelets 285.0 150.0 - 400.0 K/uL   Neutrophils Relative % 55.9 43.0 - 77.0 %   Lymphocytes Relative 31.7 12.0 - 46.0 %   Monocytes Relative 9.1 3.0 - 12.0 %   Eosinophils Relative 2.8 0.0 - 5.0 %   Basophils Relative 0.5 0.0 - 3.0 %   Neutro Abs 2.5 1.4 - 7.7 K/uL   Lymphs Abs 1.4 0.7 - 4.0 K/uL   Monocytes Absolute 0.4 0.1 - 1.0 K/uL    Eosinophils Absolute 0.1 0.0 - 0.7 K/uL   Basophils Absolute 0.0 0.0 - 0.1 K/uL  Lipid panel  Result Value Ref Range   Cholesterol 225 (H) 0 - 200 mg/dL   Triglycerides 127.0 0.0 - 149.0 mg/dL   HDL 41.40 >39.00 mg/dL   VLDL 25.4 0.0 - 40.0 mg/dL   LDL Cholesterol 159 (H) 0 - 99 mg/dL   Total CHOL/HDL Ratio 5    NonHDL 183.96   Comprehensive metabolic panel  Result Value Ref Range   Sodium 138 135 - 145 mEq/L   Potassium 4.6 3.5 - 5.1 mEq/L   Chloride 102 96 - 112 mEq/L   CO2 29 19 - 32 mEq/L   Glucose, Bld 109 (H) 70 - 99 mg/dL   BUN 15 6 - 23 mg/dL   Creatinine, Ser 1.04 0.40 - 1.50 mg/dL  Total Bilirubin 0.9 0.2 - 1.2 mg/dL   Alkaline Phosphatase 67 39 - 117 U/L   AST 18 0 - 37 U/L   ALT 29 0 - 53 U/L   Total Protein 7.0 6.0 - 8.3 g/dL   Albumin 4.4 3.5 - 5.2 g/dL   Calcium 9.4 8.4 - 10.5 mg/dL   GFR 78.42 >60.00 mL/min      Assessment & Plan:   Problem List Items Addressed This Visit    Ulcerative colitis ([place])    Chronic, stable period.  Continue current regimen with Q42mo labs.  Sees GI Q2 yrs in [place].       Thyroid nodule    Mild fullness persists - had normal [place] 2018      Hyperlipidemia, unspecified    Chronic, not on meds. Discussed statin benefits - will start 20mg  atorvastatin.  The 10-year ASCVD risk score Mikey Bussing DC Brooke Bonito., et al., 2013) is: 8.8%   Values used to calculate the score:     Age: 64 years     Sex: Male     Is Non-Hispanic African American: No     Diabetic: No     Tobacco smoker: No     Systolic Blood Pressure: 962 mmHg     Is BP treated: No     HDL Cholesterol: 41.4 mg/dL     Total Cholesterol: 225 mg/dL       Relevant Medications   atorvastatin (LIPITOR) 20 MG tablet   Hyperglycemia    Consider checking A1c next labs.       Health maintenance examination - Primary    Preventative protocols reviewed and updated unless pt declined. Discussed healthy diet and lifestyle.           Meds ordered this encounter  Medications    . atorvastatin (LIPITOR) 20 MG tablet    Sig: Take 1 tablet (20 mg total) by mouth daily.    Dispense:  30 tablet    Refill:  6   No orders of the defined types were placed in this encounter.   Follow up plan: Return in about 1 year (around 08/15/2019) for annual exam, prior fasting for blood work.  Ria Bush, MD

## 2018-08-14 ENCOUNTER — Ambulatory Visit (INDEPENDENT_AMBULATORY_CARE_PROVIDER_SITE_OTHER): Payer: 59 | Admitting: Family Medicine

## 2018-08-14 ENCOUNTER — Encounter: Payer: Self-pay | Admitting: Family Medicine

## 2018-08-14 VITALS — BP 134/64 | HR 73 | Temp 98.0°F | Ht 68.0 in | Wt 173.0 lb

## 2018-08-14 DIAGNOSIS — E785 Hyperlipidemia, unspecified: Secondary | ICD-10-CM | POA: Diagnosis not present

## 2018-08-14 DIAGNOSIS — K5190 Ulcerative colitis, unspecified, without complications: Secondary | ICD-10-CM | POA: Diagnosis not present

## 2018-08-14 DIAGNOSIS — R739 Hyperglycemia, unspecified: Secondary | ICD-10-CM

## 2018-08-14 DIAGNOSIS — E041 Nontoxic single thyroid nodule: Secondary | ICD-10-CM | POA: Diagnosis not present

## 2018-08-14 DIAGNOSIS — Z0000 Encounter for general adult medical examination without abnormal findings: Secondary | ICD-10-CM

## 2018-08-14 MED ORDER — ATORVASTATIN CALCIUM 20 MG PO TABS: 20.0000 mg | ORAL_TABLET | Freq: Every day | ORAL | 6 refills | Status: DC

## 2018-08-14 NOTE — Assessment & Plan Note (Signed)
Chronic, not on meds. Discussed statin benefits - will start 20mg  atorvastatin.  The 10-year ASCVD risk score Mikey Bussing DC Brooke Bonito., et al., 2013) is: 8.8%   Values used to calculate the score:     Age: 57 years     Sex: Male     Is Non-Hispanic African American: No     Diabetic: No     Tobacco smoker: No     Systolic Blood Pressure: 695 mmHg     Is BP treated: No     HDL Cholesterol: 41.4 mg/dL     Total Cholesterol: 225 mg/dL

## 2018-08-14 NOTE — Assessment & Plan Note (Signed)
Mild fullness persists - had normal [place] 2018

## 2018-08-14 NOTE — Patient Instructions (Addendum)
You are doing well today Start atorvastatin 20mg  daily. Let me know if intolerable side effects. Continue current medicines otherwise.  Return as needed or in 6 months for lab visit and in 1 year for next physical.   Health Maintenance, Male A healthy lifestyle and preventive care is important for your health and wellness. Ask your health care provider about what schedule of regular examinations is right for you. What should I know about weight and diet? Eat a Healthy Diet  Eat plenty of vegetables, fruits, whole grains, low-fat dairy products, and lean protein.  Do not eat a lot of foods high in solid fats, added sugars, or salt.  Maintain a Healthy Weight Regular exercise can help you achieve or maintain a healthy weight. You should:  Do at least 150 minutes of exercise each week. The exercise should increase your heart rate and make you sweat (moderate-intensity exercise).  Do strength-training exercises at least twice a week.  Watch Your Levels of Cholesterol and Blood Lipids  Have your blood tested for lipids and cholesterol every 5 years starting at 56 years of age. If you are at high risk for heart disease, you should start having your blood tested when you are 56 years old. You may need to have your cholesterol levels checked more often if: ? Your lipid or cholesterol levels are high. ? You are older than 56 years of age. ? You are at high risk for heart disease.  What should I know about cancer screening? Many types of cancers can be detected early and may often be prevented. Lung Cancer  You should be screened every year for lung cancer if: ? You are a current smoker who has smoked for at least 30 years. ? You are a former smoker who has quit within the past 15 years.  Talk to your health care provider about your screening options, when you should start screening, and how often you should be screened.  Colorectal Cancer  Routine colorectal cancer screening usually  begins at 56 years of age and should be repeated every 5-10 years until you are 56 years old. You may need to be screened more often if early forms of precancerous polyps or small growths are found. Your health care provider may recommend screening at an earlier age if you have risk factors for colon cancer.  Your health care provider may recommend using home test kits to check for hidden blood in the stool.  A small camera at the end of a tube can be used to examine your colon (sigmoidoscopy or colonoscopy). This checks for the earliest forms of colorectal cancer.  Prostate and Testicular Cancer  Depending on your age and overall health, your health care provider may do certain tests to screen for prostate and testicular cancer.  Talk to your health care provider about any symptoms or concerns you have about testicular or prostate cancer.  Skin Cancer  Check your skin from head to toe regularly.  Tell your health care provider about any new moles or changes in moles, especially if: ? There is a change in a mole's size, shape, or color. ? You have a mole that is larger than a pencil eraser.  Always use sunscreen. Apply sunscreen liberally and repeat throughout the day.  Protect yourself by wearing long sleeves, pants, a wide-brimmed hat, and sunglasses when outside.  What should I know about heart disease, diabetes, and high blood pressure?  If you are 56-66 years of age, have your blood  pressure checked every 3-5 years. If you are 83 years of age or older, have your blood pressure checked every year. You should have your blood pressure measured twice-once when you are at a hospital or clinic, and once when you are not at a hospital or clinic. Record the average of the two measurements. To check your blood pressure when you are not at a hospital or clinic, you can use: ? An automated blood pressure machine at a pharmacy. ? A home blood pressure monitor.  Talk to your health care  provider about your target blood pressure.  If you are between 90-9 years old, ask your health care provider if you should take aspirin to prevent heart disease.  Have regular diabetes screenings by checking your fasting blood sugar level. ? If you are at a normal weight and have a low risk for diabetes, have this test once every three years after the age of 5. ? If you are overweight and have a high risk for diabetes, consider being tested at a younger age or more often.  A one-time screening for abdominal aortic aneurysm (AAA) by ultrasound is recommended for men aged 56-75 years who are current or former smokers. What should I know about preventing infection? Hepatitis B If you have a higher risk for hepatitis B, you should be screened for this virus. Talk with your health care provider to find out if you are at risk for hepatitis B infection. Hepatitis C Blood testing is recommended for:  Everyone born from [birth year] through 1964.  Anyone with known risk factors for hepatitis C.  Sexually Transmitted Diseases (STDs)  You should be screened each year for STDs including gonorrhea and chlamydia if: ? You are sexually active and are younger than 56 years of age. ? You are older than 56 years of age and your health care provider tells you that you are at risk for this type of infection. ? Your sexual activity has changed since you were last screened and you are at an increased risk for chlamydia or gonorrhea. Ask your health care provider if you are at risk.  Talk with your health care provider about whether you are at high risk of being infected with HIV. Your health care provider may recommend a prescription medicine to help prevent HIV infection.  What else can I do?  Schedule regular health, dental, and eye exams.  Stay current with your vaccines (immunizations).  Do not use any tobacco products, such as cigarettes, chewing tobacco, and e-cigarettes. If you need help quitting, ask  your health care provider.  Limit alcohol intake to no more than 2 drinks per day. One drink equals 12 ounces of beer, 5 ounces of wine, or 1 ounces of hard liquor.  Do not use street drugs.  Do not share needles.  Ask your health care provider for help if you need support or information about quitting drugs.  Tell your health care provider if you often feel depressed.  Tell your health care provider if you have ever been abused or do not feel safe at home. This information is not intended to replace advice given to you by your health care provider. Make sure you discuss any questions you have with your health care provider. Document Released: 04/04/2008 Document Revised: 06/05/2016 Document Reviewed: 07/11/2015 Elsevier Interactive Patient Education  [employer].

## 2018-08-14 NOTE — Assessment & Plan Note (Addendum)
Chronic, stable period.  Continue current regimen with Q51mo labs.  Sees GI Q2 yrs in [place].

## 2018-08-14 NOTE — Assessment & Plan Note (Signed)
Consider checking A1c next labs.

## 2018-08-19 ENCOUNTER — Other Ambulatory Visit: Payer: Self-pay | Admitting: *Deleted

## 2018-08-19 MED ORDER — ATORVASTATIN CALCIUM 20 MG PO TABS: 20.0000 mg | ORAL_TABLET | Freq: Every day | ORAL | 3 refills | Status: DC

## 2018-09-02 ENCOUNTER — Encounter: Payer: Self-pay | Admitting: Family Medicine

## 2018-09-02 ENCOUNTER — Telehealth: Payer: Self-pay

## 2018-09-02 NOTE — Telephone Encounter (Addendum)
Received fax from [facility] stating rx does not need PA.   Spoke with [facility] about rx, states it cannot be filled until 09/06/18.   Notified pt via [facility].

## 2018-09-02 NOTE — Telephone Encounter (Signed)
Started atorvastatin PA.  Theressa Millard TE, 09/02/18.]

## 2018-09-02 NOTE — Telephone Encounter (Addendum)
Received MyChart message from pt stating Dr. Darnell Level needed to approve atorvastatin 20 mg tab. [See Pt Msg, 09/02/18.]  Started PA for atorvastatin 20 mg tab, key:  [id number], PA case ID:  [id number]. Decision pending.

## 2018-10-22 ENCOUNTER — Encounter: Payer: Self-pay | Admitting: Family Medicine

## 2018-10-22 ENCOUNTER — Ambulatory Visit: Payer: 59 | Admitting: Family Medicine

## 2018-10-22 VITALS — BP 138/76 | HR 60 | Temp 97.8°F | Ht 68.0 in | Wt 171.8 lb

## 2018-10-22 DIAGNOSIS — R109 Unspecified abdominal pain: Secondary | ICD-10-CM

## 2018-10-22 DIAGNOSIS — E785 Hyperlipidemia, unspecified: Secondary | ICD-10-CM | POA: Diagnosis not present

## 2018-10-22 DIAGNOSIS — B029 Zoster without complications: Secondary | ICD-10-CM | POA: Insufficient documentation

## 2018-10-22 DIAGNOSIS — R21 Rash and other nonspecific skin eruption: Secondary | ICD-10-CM | POA: Insufficient documentation

## 2018-10-22 DIAGNOSIS — R739 Hyperglycemia, unspecified: Secondary | ICD-10-CM

## 2018-10-22 HISTORY — DX: Zoster without complications: B02.9

## 2018-10-22 MED ORDER — VALACYCLOVIR HCL 1 G PO TABS: 1000.0000 mg | ORAL_TABLET | Freq: Three times a day (TID) | ORAL | 0 refills | Status: DC

## 2018-10-22 NOTE — Assessment & Plan Note (Signed)
Will return for FLP on statin in 4-6 wks.

## 2018-10-22 NOTE — Patient Instructions (Signed)
I do think you have shingles - treat with valtrex three times daily for 1 week.   Shingles  Shingles, which is also known as herpes zoster, is an infection that causes a painful skin rash and fluid-filled blisters. It is caused by a virus. Shingles only develops in people who:  Have had chickenpox.  Have been given a medicine to protect against chickenpox (have been vaccinated). Shingles is rare in this group. What are the causes? Shingles is caused by varicella-zoster virus (VZV). This is the same virus that causes chickenpox. After a person is exposed to VZV, the virus stays in the body in an inactive (dormant) state. Shingles develops if the virus is reactivated. This can happen many years after the first (initial) exposure to VZV. It is not known what causes this virus to be reactivated. What increases the risk? People who have had chickenpox or received the chickenpox vaccine are at risk for shingles. Shingles infection is more common in people who:  Are older than age 51.  Have a weakened disease-fighting system (immune system), such as people with: ? HIV. ? AIDS. ? Cancer.  Are taking medicines that weaken the immune system, such as transplant medicines.  Are experiencing a lot of stress. What are the signs or symptoms? Early symptoms of this condition include itching, tingling, and pain in an area on your skin. Pain may be described as burning, stabbing, or throbbing. A few days or weeks after early symptoms start, a painful red rash appears. The rash is usually on one side of the body and has a band-like or belt-like pattern. The rash eventually turns into fluid-filled blisters that break open, change into scabs, and dry up in about 2-3 weeks. At any time during the infection, you may also develop:  A fever.  Chills.  A headache.  An upset stomach. How is this diagnosed? This condition is diagnosed with a skin exam. Skin or fluid samples may be taken from the blisters  before a diagnosis is made. These samples are examined under a microscope or sent to a lab for testing. How is this treated? The rash may last for several weeks. There is not a specific cure for this condition. Your health care provider will probably prescribe medicines to help you manage pain, recover more quickly, and avoid long-term problems. Medicines may include:  Antiviral drugs.  Anti-inflammatory drugs.  Pain medicines.  Anti-itching medicines (antihistamines). If the area involved is on your face, you may be referred to a specialist, such as an eye doctor (ophthalmologist) or an ear, nose, and throat (ENT) doctor (otolaryngologist) to help you avoid eye problems, chronic pain, or disability. Follow these instructions at home: Medicines  Take over-the-counter and prescription medicines only as told by your health care provider.  Apply an anti-itch cream or numbing cream to the affected area as told by your health care provider. Relieving itching and discomfort   Apply cold, wet cloths (cold compresses) to the area of the rash or blisters as told by your health care provider.  Cool baths can be soothing. Try adding baking soda or dry oatmeal to the water to reduce itching. Do not bathe in hot water. Blister and rash care  Keep your rash covered with a loose bandage (dressing). Wear loose-fitting clothing to help ease the pain of material rubbing against the rash.  Keep your rash and blisters clean by washing the area with mild soap and cool water as told by your health care provider.  Check  your rash every day for signs of infection. Check for: ? More redness, swelling, or pain. ? Fluid or blood. ? Warmth. ? Pus or a bad smell.  Do not scratch your rash or pick at your blisters. To help avoid scratching: ? Keep your fingernails clean and cut short. ? Wear gloves or mittens while you sleep, if scratching is a problem. General instructions  Rest as told by your health  care provider.  Keep all follow-up visits as told by your health care provider. This is important.  Wash your hands often with soap and water. If soap and water are not available, use hand sanitizer. Doing this lowers your chance of getting a bacterial skin infection.  Before your blisters change into scabs, your shingles infection can cause chickenpox in people who have never had it or have never been vaccinated against it. To prevent this from happening, avoid contact with other people, especially: ? Babies. ? Pregnant women. ? Children who have eczema. ? Elderly people who have transplants. ? People who have chronic illnesses, such as cancer or AIDS. Contact a health care provider if:  Your pain is not relieved with prescribed medicines.  Your pain does not get better after the rash heals.  You have signs of infection in the rash area, such as: ? More redness, swelling, or pain around the rash. ? Fluid or blood coming from the rash. ? The rash area feeling warm to the touch. ? Pus or a bad smell coming from the rash. Get help right away if:  The rash is on your face or nose.  You have facial pain, pain around your eye area, or loss of feeling on one side of your face.  You have difficulty seeing.  You have ear pain or have ringing in your ear.  You have a loss of taste.  Your condition gets worse. Summary  Shingles, which is also known as herpes zoster, is an infection that causes a painful skin rash and fluid-filled blisters.  This condition is diagnosed with a skin exam. Skin or fluid samples may be taken from the blisters and examined before the diagnosis is made.  Keep your rash covered with a loose bandage (dressing). Wear loose-fitting clothing to help ease the pain of material rubbing against the rash.  Before your blisters change into scabs, your shingles infection can cause chickenpox in people who have never had it or have never been vaccinated against  it. This information is not intended to replace advice given to you by your health care provider. Make sure you discuss any questions you have with your health care provider. Document Released: 10/07/2005 Document Revised: 06/11/2017 Document Reviewed: 06/11/2017 Elsevier Interactive Patient Education  2019 [employer].

## 2018-10-22 NOTE — Progress Notes (Signed)
BP 138/76 (BP Location: Left Arm, Patient Position: Sitting, Cuff Size: Normal)   Pulse 60   Temp 97.8 F (36.6 C) (Oral)   Ht 5\' 8"  (1.727 m)   Wt 171 lb 12 oz (77.9 kg)   SpO2 97%   BMI 26.11 kg/m    CC: R flank pain Subjective:    Patient ID: Dustin Salazar, male    DOB: 1961-07-13, 58 y.o.   MRN: [id number]  HPI: [name] is a 58 y.o. male presenting on 10/22/2018 for Flank Pain (right side) and Rash   5-6d h/o R flank pain after running, rash started 2 days ago. Pain described as intermittent superficial sharp. No fevers. Rash located to right side, doesn't cross midline. No urinary symptoms.   No h/o shingles, did have chicken pox growing up.   Known UC managed on azathioprine and lialda followed by [facility].   Would like eval for rough spots on skin - hands and face     Relevant past medical, surgical, family and social history reviewed and updated as indicated. Interim medical history since our last visit reviewed. Allergies and medications reviewed and updated. Outpatient Medications Prior to Visit  Medication Sig Dispense Refill  . albuterol (PROVENTIL HFA;VENTOLIN HFA) 108 (90 Base) MCG/ACT inhaler Inhale 1-2 puffs into the lungs as needed for wheezing or shortness of breath (Uses with exercise/running).    Marland Kitchen atorvastatin (LIPITOR) 20 MG tablet Take 1 tablet (20 mg total) by mouth daily. 90 tablet 3  . azaTHIOprine (IMURAN) 50 MG tablet TAKE 2 TABLETS BY MOUTH  DAILY 180 tablet 3  . cetirizine (ZYRTEC) 10 MG tablet Take 10 mg by mouth daily.    Marland Kitchen LIALDA 1.2 g EC tablet TAKE 1 TABLET BY MOUTH TWO  TIMES DAILY 180 tablet 1  . Naproxen Sodium 220 MG CAPS Take 1 capsule by mouth 2 (two) times daily.    . Cholecalciferol (VITAMIN D3) 2000 units TABS Take 2,000 Units by mouth daily.     No facility-administered medications prior to visit.      Per HPI unless specifically indicated in ROS section below Review of Systems Objective:    BP 138/76  (BP Location: Left Arm, Patient Position: Sitting, Cuff Size: Normal)   Pulse 60   Temp 97.8 F (36.6 C) (Oral)   Ht 5\' 8"  (1.727 m)   Wt 171 lb 12 oz (77.9 kg)   SpO2 97%   BMI 26.11 kg/m   Wt Readings from Last 3 Encounters:  10/22/18 171 lb 12 oz (77.9 kg)  08/14/18 173 lb (78.5 kg)  08/12/17 171 lb (77.6 kg)    Physical Exam Vitals signs and nursing note reviewed.  Constitutional:      General: He is not in acute distress.    Appearance: Normal appearance.  Skin:    General: Skin is warm and dry.     Findings: Erythema and rash present.          Comments: Cluster of drying vesicles on erythematous base R lower back with few spots extending laterally across flank to anterior lower abdomen Rough patches on L dorsal hand as well as mid forehead  Neurological:     Mental Status: He is alert.        Assessment & Plan:   Problem List Items Addressed This Visit    Skin rash    Rough spots on dorsal hand and face possible AKs - will refer to derm for eval. Pt agrees with  plan.       Relevant Orders   Ambulatory referral to Dermatology   Hyperlipidemia, unspecified    Will return for FLP on statin in 4-6 wks.       Relevant Orders   Lipid panel   Hyperglycemia   Relevant Orders   Basic metabolic panel   Hemoglobin A1c   Herpes zoster - Primary    Given new lesions will treat with valtrex although outside of 72hr window. Discussed NSAID use, offered gabapentin but pt declined. Update if not improving with treatment.       Relevant Medications   valACYclovir (VALTREX) 1000 MG tablet    Other Visit Diagnoses    Flank pain           Meds ordered this encounter  Medications  . valACYclovir (VALTREX) 1000 MG tablet    Sig: Take 1 tablet (1,000 mg total) by mouth 3 (three) times daily.    Dispense:  21 tablet    Refill:  0   Orders Placed This Encounter  Procedures  . Basic metabolic panel    Standing Status:   Future    Standing Expiration Date:    10/23/2019  . Hemoglobin A1c    Standing Status:   Future    Standing Expiration Date:   10/23/2019  . Lipid panel    Standing Status:   Future    Standing Expiration Date:   10/23/2019  . Ambulatory referral to Dermatology    Referral Priority:   Routine    Referral Type:   Consultation    Referral Reason:   Specialty Services Required    Requested Specialty:   Dermatology    Number of Visits Requested:   1    Follow up plan: No follow-ups on file.  Ria Bush, MD

## 2018-10-22 NOTE — Assessment & Plan Note (Signed)
Rough spots on dorsal hand and face possible AKs - will refer to derm for eval. Pt agrees with plan.

## 2018-10-22 NOTE — Assessment & Plan Note (Signed)
Given new lesions will treat with valtrex although outside of 72hr window. Discussed NSAID use, offered gabapentin but pt declined. Update if not improving with treatment.

## 2018-10-23 ENCOUNTER — Ambulatory Visit: Payer: 59 | Admitting: Family Medicine

## 2018-11-12 DIAGNOSIS — L821 Other seborrheic keratosis: Secondary | ICD-10-CM | POA: Diagnosis not present

## 2018-11-12 DIAGNOSIS — X32XXXA Exposure to sunlight, initial encounter: Secondary | ICD-10-CM | POA: Diagnosis not present

## 2018-11-12 DIAGNOSIS — L218 Other seborrheic dermatitis: Secondary | ICD-10-CM | POA: Diagnosis not present

## 2018-11-12 DIAGNOSIS — L570 Actinic keratosis: Secondary | ICD-10-CM | POA: Diagnosis not present

## 2018-12-04 ENCOUNTER — Other Ambulatory Visit: Payer: Self-pay | Admitting: Family Medicine

## 2018-12-07 ENCOUNTER — Other Ambulatory Visit (INDEPENDENT_AMBULATORY_CARE_PROVIDER_SITE_OTHER): Payer: 59

## 2018-12-07 DIAGNOSIS — E785 Hyperlipidemia, unspecified: Secondary | ICD-10-CM | POA: Diagnosis not present

## 2018-12-07 DIAGNOSIS — R739 Hyperglycemia, unspecified: Secondary | ICD-10-CM | POA: Diagnosis not present

## 2018-12-07 LAB — BASIC METABOLIC PANEL
BUN: 9 mg/dL (ref 6–23)
CHLORIDE: 103 meq/L (ref 96–112)
CO2: 29 mEq/L (ref 19–32)
Calcium: 9.1 mg/dL (ref 8.4–10.5)
Creatinine, Ser: 0.94 mg/dL (ref 0.40–1.50)
GFR: 82.82 mL/min (ref >60.00)
Glucose, Bld: 98 mg/dL (ref 70–99)
Potassium: 4.4 mEq/L (ref 3.5–5.1)
Sodium: 138 mEq/L (ref 135–145)

## 2018-12-07 LAB — LIPID PANEL
CHOL/HDL RATIO: 3
Cholesterol: 128 mg/dL (ref 0–200)
HDL: 37.2 mg/dL — AB (ref >39.00)
LDL Cholesterol: 68 mg/dL (ref 0–99)
NONHDL: 90.75
Triglycerides: 113 mg/dL (ref 0.0–149.0)
VLDL: 22.6 mg/dL (ref 0.0–40.0)

## 2018-12-07 LAB — HEMOGLOBIN A1C: Hgb A1c MFr Bld: 5.4 % (ref 4.6–6.5)

## 2018-12-07 NOTE — Telephone Encounter (Signed)
Electronic refill request. Azathioprine Last office visit:   10/22/18 Acute Last CPE 08/11/18 Last Filled:    180 tablet 3 01/28/2018  Please advise.

## 2018-12-14 DIAGNOSIS — Z0100 Encounter for examination of eyes and vision without abnormal findings: Secondary | ICD-10-CM | POA: Diagnosis not present

## 2018-12-18 DIAGNOSIS — L570 Actinic keratosis: Secondary | ICD-10-CM | POA: Diagnosis not present

## 2018-12-18 DIAGNOSIS — X32XXXD Exposure to sunlight, subsequent encounter: Secondary | ICD-10-CM | POA: Diagnosis not present

## 2018-12-29 ENCOUNTER — Other Ambulatory Visit: Payer: Self-pay | Admitting: Family Medicine

## 2018-12-29 NOTE — Telephone Encounter (Signed)
Lialda Last filled:  10/23/18, #180 Last OV:  10/22/18, acute Next OV:  08/16/19, CPE

## 2019-02-12 ENCOUNTER — Other Ambulatory Visit: Payer: Self-pay | Admitting: Family Medicine

## 2019-02-12 ENCOUNTER — Other Ambulatory Visit: Payer: Self-pay

## 2019-02-12 ENCOUNTER — Other Ambulatory Visit: Payer: 59

## 2019-02-12 ENCOUNTER — Other Ambulatory Visit (INDEPENDENT_AMBULATORY_CARE_PROVIDER_SITE_OTHER): Payer: 59

## 2019-02-12 DIAGNOSIS — E785 Hyperlipidemia, unspecified: Secondary | ICD-10-CM | POA: Diagnosis not present

## 2019-02-12 DIAGNOSIS — R739 Hyperglycemia, unspecified: Secondary | ICD-10-CM

## 2019-02-12 LAB — HEPATIC FUNCTION PANEL
ALT: 56 U/L — ABNORMAL HIGH (ref 0–53)
AST: 41 U/L — ABNORMAL HIGH (ref 0–37)
Albumin: 4.2 g/dL (ref 3.5–5.2)
Alkaline Phosphatase: 70 U/L (ref 39–117)
Bilirubin, Direct: 0.1 mg/dL (ref 0.0–0.3)
Total Bilirubin: 0.6 mg/dL (ref 0.2–1.2)
Total Protein: 6.6 g/dL (ref 6.0–8.3)

## 2019-02-12 LAB — LIPID PANEL
Cholesterol: 124 mg/dL (ref 0–200)
HDL: 40.1 mg/dL (ref >39.00)
LDL Cholesterol: 73 mg/dL (ref 0–99)
NonHDL: 84.12
Total CHOL/HDL Ratio: 3
Triglycerides: 58 mg/dL (ref 0.0–149.0)
VLDL: 11.6 mg/dL (ref 0.0–40.0)

## 2019-02-14 ENCOUNTER — Other Ambulatory Visit: Payer: Self-pay | Admitting: Family Medicine

## 2019-06-26 ENCOUNTER — Other Ambulatory Visit: Payer: Self-pay | Admitting: Family Medicine

## 2019-08-13 ENCOUNTER — Other Ambulatory Visit: Payer: 59

## 2019-08-16 ENCOUNTER — Encounter: Payer: 59 | Admitting: Family Medicine

## 2019-09-06 ENCOUNTER — Other Ambulatory Visit: Payer: Self-pay | Admitting: Family Medicine

## 2019-09-06 DIAGNOSIS — E785 Hyperlipidemia, unspecified: Secondary | ICD-10-CM

## 2019-09-06 DIAGNOSIS — K5190 Ulcerative colitis, unspecified, without complications: Secondary | ICD-10-CM

## 2019-09-06 DIAGNOSIS — Z125 Encounter for screening for malignant neoplasm of prostate: Secondary | ICD-10-CM

## 2019-09-06 DIAGNOSIS — E041 Nontoxic single thyroid nodule: Secondary | ICD-10-CM

## 2019-09-07 ENCOUNTER — Other Ambulatory Visit: Payer: Self-pay

## 2019-09-07 ENCOUNTER — Other Ambulatory Visit (INDEPENDENT_AMBULATORY_CARE_PROVIDER_SITE_OTHER): Payer: 59

## 2019-09-07 DIAGNOSIS — E785 Hyperlipidemia, unspecified: Secondary | ICD-10-CM | POA: Diagnosis not present

## 2019-09-07 DIAGNOSIS — Z125 Encounter for screening for malignant neoplasm of prostate: Secondary | ICD-10-CM | POA: Diagnosis not present

## 2019-09-07 DIAGNOSIS — E041 Nontoxic single thyroid nodule: Secondary | ICD-10-CM | POA: Diagnosis not present

## 2019-09-07 DIAGNOSIS — K5190 Ulcerative colitis, unspecified, without complications: Secondary | ICD-10-CM | POA: Diagnosis not present

## 2019-09-07 LAB — TSH: TSH: 1.42 u[IU]/mL (ref 0.35–4.50)

## 2019-09-07 LAB — COMPREHENSIVE METABOLIC PANEL
ALT: 45 U/L (ref 0–53)
AST: 29 U/L (ref 0–37)
Albumin: 4.5 g/dL (ref 3.5–5.2)
Alkaline Phosphatase: 65 U/L (ref 39–117)
BUN: 13 mg/dL (ref 6–23)
CO2: 28 mEq/L (ref 19–32)
Calcium: 9.5 mg/dL (ref 8.4–10.5)
Chloride: 104 mEq/L (ref 96–112)
Creatinine, Ser: 0.9 mg/dL (ref 0.40–1.50)
GFR: 86.85 mL/min (ref >60.00)
Glucose, Bld: 104 mg/dL — ABNORMAL HIGH (ref 70–99)
Potassium: 4.9 mEq/L (ref 3.5–5.1)
Sodium: 141 mEq/L (ref 135–145)
Total Bilirubin: 0.9 mg/dL (ref 0.2–1.2)
Total Protein: 6.9 g/dL (ref 6.0–8.3)

## 2019-09-07 LAB — CBC WITH DIFFERENTIAL/PLATELET
Basophils Absolute: 0.1 10*3/uL (ref 0.0–0.1)
Basophils Relative: 1.1 % (ref 0.0–3.0)
Eosinophils Absolute: 0.1 10*3/uL (ref 0.0–0.7)
Eosinophils Relative: 2.2 % (ref 0.0–5.0)
HCT: 46.4 % (ref 39.0–52.0)
Hemoglobin: 15.5 g/dL (ref 13.0–17.0)
Lymphocytes Relative: 27.7 % (ref 12.0–46.0)
Lymphs Abs: 1.4 10*3/uL (ref 0.7–4.0)
MCHC: 33.4 g/dL (ref 30.0–36.0)
MCV: 97.6 fl (ref 78.0–100.0)
Monocytes Absolute: 0.4 10*3/uL (ref 0.1–1.0)
Monocytes Relative: 7.9 % (ref 3.0–12.0)
Neutro Abs: 3 10*3/uL (ref 1.4–7.7)
Neutrophils Relative %: 61.1 % (ref 43.0–77.0)
Platelets: 277 10*3/uL (ref 150.0–400.0)
RBC: 4.76 Mil/uL (ref 4.22–5.81)
RDW: 13.8 % (ref 11.5–15.5)
WBC: 5 10*3/uL (ref 4.0–10.5)

## 2019-09-07 LAB — LIPID PANEL
Cholesterol: 164 mg/dL (ref 0–200)
HDL: 44.2 mg/dL (ref >39.00)
LDL Cholesterol: 99 mg/dL (ref 0–99)
NonHDL: 120.19
Total CHOL/HDL Ratio: 4
Triglycerides: 104 mg/dL (ref 0.0–149.0)
VLDL: 20.8 mg/dL (ref 0.0–40.0)

## 2019-09-07 LAB — PSA: PSA: 0.4 ng/mL (ref 0.10–4.00)

## 2019-09-15 ENCOUNTER — Ambulatory Visit (INDEPENDENT_AMBULATORY_CARE_PROVIDER_SITE_OTHER)
Admission: RE | Admit: 2019-09-15 | Discharge: 2019-09-15 | Disposition: A | Discharge status: Home / Self Care | Payer: 59 | Source: Ambulatory Visit | Attending: Family Medicine | Admitting: Family Medicine

## 2019-09-15 ENCOUNTER — Other Ambulatory Visit: Payer: Self-pay

## 2019-09-15 ENCOUNTER — Ambulatory Visit
Admission: RE | Admit: 2019-09-15 | Discharge: 2019-09-15 | Disposition: A | Discharge status: Home / Self Care | Payer: 59 | Source: Ambulatory Visit | Attending: Family Medicine | Admitting: Family Medicine

## 2019-09-15 ENCOUNTER — Encounter: Payer: Self-pay | Admitting: Family Medicine

## 2019-09-15 ENCOUNTER — Ambulatory Visit (INDEPENDENT_AMBULATORY_CARE_PROVIDER_SITE_OTHER): Payer: 59 | Admitting: Family Medicine

## 2019-09-15 VITALS — BP 118/80 | HR 80 | Temp 98.1°F | Ht 67.0 in | Wt 168.6 lb

## 2019-09-15 DIAGNOSIS — K639 Disease of intestine, unspecified: Secondary | ICD-10-CM | POA: Insufficient documentation

## 2019-09-15 DIAGNOSIS — Z23 Encounter for immunization: Secondary | ICD-10-CM | POA: Diagnosis not present

## 2019-09-15 DIAGNOSIS — M0760 Enteropathic arthropathies, unspecified site: Secondary | ICD-10-CM

## 2019-09-15 DIAGNOSIS — K5190 Ulcerative colitis, unspecified, without complications: Secondary | ICD-10-CM | POA: Diagnosis not present

## 2019-09-15 DIAGNOSIS — R739 Hyperglycemia, unspecified: Secondary | ICD-10-CM

## 2019-09-15 DIAGNOSIS — E785 Hyperlipidemia, unspecified: Secondary | ICD-10-CM

## 2019-09-15 DIAGNOSIS — Z0000 Encounter for general adult medical examination without abnormal findings: Secondary | ICD-10-CM

## 2019-09-15 MED ORDER — CELECOXIB 100 MG PO CAPS: 100.0000 mg | ORAL_CAPSULE | Freq: Every day | ORAL | 1 refills | Status: DC

## 2019-09-15 NOTE — Assessment & Plan Note (Addendum)
Describes inflammatory peripheral arthritis of bilateral hands (MCP, PIPs) as well as wrists with decreased ROM. AM pain, stiffness that does improve as day progresses. No lower back pain. Interested in trial of selective COX-2 NSAID so will trial celebrex 143m once daily with option to increase to BID dosing. Baseline xrays of hands today, consider rheum eval if progressing symptoms. Consider inflammatory arthritis eval (ESR, RF, etc) at next labwork.

## 2019-09-15 NOTE — Assessment & Plan Note (Signed)
Stable period. Appreciate GI care. Continues f/u with [facility] - will call for upcoming appt.

## 2019-09-15 NOTE — Assessment & Plan Note (Signed)
Chronic, stable on low dose lipitor. Higher dose may have caused mild transaminitis. Now tolerating well, continue current regimen.   The 10-year ASCVD risk score Mikey Bussing DC Brooke Bonito., et al., 2013) is: 5.2%   Values used to calculate the score:     Age: 58 years     Sex: Male     Is Non-Hispanic African American: No     Diabetic: No     Tobacco smoker: No     Systolic Blood Pressure: 123456 mmHg     Is BP treated: No     HDL Cholesterol: 44.2 mg/dL     Total Cholesterol: 164 mg/dL

## 2019-09-15 NOTE — Patient Instructions (Addendum)
Flu shot today Let's try celebrex at 100mg  daily in the morning.  Baseline hand xrays today. If celebrex doesn't help or worsening hand pain/stiffness noted let me know for rheumatology evaluation.   Health Maintenance, Male Adopting a healthy lifestyle and getting preventive care are important in promoting health and wellness. Ask your health care provider about:  The right schedule for you to have regular tests and exams.  Things you can do on your own to prevent diseases and keep yourself healthy. What should I know about diet, weight, and exercise? Eat a healthy diet   Eat a diet that includes plenty of vegetables, fruits, low-fat dairy products, and lean protein.  Do not eat a lot of foods that are high in solid fats, added sugars, or sodium. Maintain a healthy weight Body mass index (BMI) is a measurement that can be used to identify possible weight problems. It estimates body fat based on height and weight. Your health care provider can help determine your BMI and help you achieve or maintain a healthy weight. Get regular exercise Get regular exercise. This is one of the most important things you can do for your health. Most adults should:  Exercise for at least 150 minutes each week. The exercise should increase your heart rate and make you sweat (moderate-intensity exercise).  Do strengthening exercises at least twice a week. This is in addition to the moderate-intensity exercise.  Spend less time sitting. Even light physical activity can be beneficial. Watch cholesterol and blood lipids Have your blood tested for lipids and cholesterol at 57 years of age, then have this test every 5 years. You may need to have your cholesterol levels checked more often if:  Your lipid or cholesterol levels are high.  You are older than 57 years of age.  You are at high risk for heart disease. What should I know about cancer screening? Many types of cancers can be detected early and may  often be prevented. Depending on your health history and family history, you may need to have cancer screening at various ages. This may include screening for:  Colorectal cancer.  Prostate cancer.  Skin cancer.  Lung cancer. What should I know about heart disease, diabetes, and high blood pressure? Blood pressure and heart disease  High blood pressure causes heart disease and increases the risk of stroke. This is more likely to develop in people who have high blood pressure readings, are of African descent, or are overweight.  Talk with your health care provider about your target blood pressure readings.  Have your blood pressure checked: ? Every 3-5 years if you are 49-29 years of age. ? Every year if you are 46 years old or older.  If you are between the ages of 36 and 54 and are a current or former smoker, ask your health care provider if you should have a one-time screening for abdominal aortic aneurysm (AAA). Diabetes Have regular diabetes screenings. This checks your fasting blood sugar level. Have the screening done:  Once every three years after age 9 if you are at a normal weight and have a low risk for diabetes.  More often and at a younger age if you are overweight or have a high risk for diabetes. What should I know about preventing infection? Hepatitis B If you have a higher risk for hepatitis B, you should be screened for this virus. Talk with your health care provider to find out if you are at risk for hepatitis B infection.  Hepatitis C Blood testing is recommended for:  Everyone born from [birth year] through 1964.  Anyone with known risk factors for hepatitis C. Sexually transmitted infections (STIs)  You should be screened each year for STIs, including gonorrhea and chlamydia, if: ? You are sexually active and are younger than 57 years of age. ? You are older than 57 years of age and your health care provider tells you that you are at risk for this type of  infection. ? Your sexual activity has changed since you were last screened, and you are at increased risk for chlamydia or gonorrhea. Ask your health care provider if you are at risk.  Ask your health care provider about whether you are at high risk for HIV. Your health care provider may recommend a prescription medicine to help prevent HIV infection. If you choose to take medicine to prevent HIV, you should first get tested for HIV. You should then be tested every 3 months for as long as you are taking the medicine. Follow these instructions at home: Lifestyle  Do not use any products that contain nicotine or tobacco, such as cigarettes, e-cigarettes, and chewing tobacco. If you need help quitting, ask your health care provider.  Do not use street drugs.  Do not share needles.  Ask your health care provider for help if you need support or information about quitting drugs. Alcohol use  Do not drink alcohol if your health care provider tells you not to drink.  If you drink alcohol: ? Limit how much you have to 0-2 drinks a day. ? Be aware of how much alcohol is in your drink. In the [place] one drink equals one 12 oz bottle of beer (355 mL), one 5 oz glass of wine (148 mL), or one 1 oz glass of hard liquor (44 mL). General instructions  Schedule regular health, dental, and eye exams.  Stay current with your vaccines.  Tell your health care provider if: ? You often feel depressed. ? You have ever been abused or do not feel safe at home. Summary  Adopting a healthy lifestyle and getting preventive care are important in promoting health and wellness.  Follow your health care provider's instructions about healthy diet, exercising, and getting tested or screened for diseases.  Follow your health care provider's instructions on monitoring your cholesterol and blood pressure. This information is not intended to replace advice given to you by your health care provider. Make sure you  discuss any questions you have with your health care provider. Document Released: 04/04/2008 Document Revised: 09/30/2018 Document Reviewed: 09/30/2018 Elsevier Patient Education  2020 [employer].

## 2019-09-15 NOTE — Assessment & Plan Note (Signed)
Mild, stable. Encouraged limiting added sugars in diet.

## 2019-09-15 NOTE — Assessment & Plan Note (Signed)
Preventative protocols reviewed and updated unless pt declined. Discussed healthy diet and lifestyle.  

## 2019-09-15 NOTE — Progress Notes (Addendum)
This visit was conducted in person.   BP 118/80 (BP Location: Left Arm, Patient Position: Sitting, Cuff Size: Normal)    Pulse 80    Temp 98.1 F (36.7 C) (Temporal)    Ht _0  (1.702 m)    Wt 168 lb 9 oz (76.5 kg)    SpO2 97%    BMI 26.40 kg/m    CC: CPE  Subjective:    Patient ID: Dustin Salazar, male    DOB: [birth year], 58 y.o.   MRN: [id number]  HPI: Dustin Salazar is a 58 y.o. male presenting on 09/15/2019 for Annual Exam   Son and fiancee had mild Covid. Son to get married next year.   IBD - controlled on low dose imuran and lialda. Sees [facility] Q24[facility].   Regularly takes aleve 2242mBID for hand arthralgias (MCPs and PIPs diffusely). Ongoing hand pain/stiffness worse in am, improves as day progresses. No significant redness/swelling/warmth of joints. Notes some loss of ROM of bilateral wrists. Interested in trial celebrex.   Preventative: COLONOSCOPY 01/2018 - SSA, rpt 2 yrs (Dr FuJulien Nordmannn [place]state cancer screening - discussed. Monitored with yearly PSA.  Flu shot yearly Tetanus 2016 Seat belt use discussed  Sunscreen use discussed, no changing moles on skin. Saw derm - s/p wart treatment to hands with cryotherapy.  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 Interim medical history since our last visit reviewed. Allergies and medications reviewed and updated. Outpatient Medications Prior to Visit  Medication Sig Dispense Refill   albuterol (PROVENTIL HFA;VENTOLIN HFA) 108 (90 Base) MCG/ACT inhaler Inhale 1-2 puffs into the lungs as needed for wheezing or  shortness of breath (Uses with exercise/running).     atorvastatin (LIPITOR) 20 MG tablet Take 1 tablet on Monday, Wednesday and Friday. 36 tablet 2   azaTHIOprine (IMURAN) 50 MG tablet TAKE 2 TABLETS BY MOUTH  DAILY 180 tablet 3   cetirizine (ZYRTEC) 10 MG tablet Take 10 mg by mouth daily.     LIALDA 1.2 g EC tablet TAKE 1 TABLET BY MOUTH TWO  TIMES DAILY 180 tablet 3   Naproxen Sodium 220 MG CAPS Take 1 capsule by mouth 2 (two) times daily.     valACYclovir (VALTREX) 1000 MG tablet Take 1 tablet (1,000 mg total) by mouth 3 (three) times daily. 21 tablet 0   No facility-administered medications prior to visit.      Per HPI unless specifically indicated in ROS section below Review of Systems  Constitutional: Negative for activity change, appetite change, chills, fatigue, fever and unexpected weight change.  HENT: Negative for hearing loss.   Eyes: Negative for visual disturbance.  Respiratory: Negative for cough, chest tightness, shortness of breath and wheezing.   Cardiovascular: Negative for chest pain, palpitations and leg swelling.  Gastrointestinal: Negative for abdominal distention, abdominal pain, blood in stool, constipation, diarrhea, nausea and vomiting.  Genitourinary: Negative for difficulty urinating and hematuria.  Musculoskeletal: Negative for arthralgias, myalgias and neck pain.  Skin: Negative for rash.  Neurological: Negative for dizziness, seizures, syncope and headaches.  Hematological: Negative for adenopathy. Does not bruise/bleed easily.  Psychiatric/Behavioral: Negative for dysphoric mood. The patient is not nervous/anxious.    Objective:    BP 118/80 (BP Location: Left Arm, Patient Position: Sitting, Cuff Size: Normal)    Pulse 80    Temp 98.1 F (36.7 C) (Temporal)    Ht _0  (1.702 m)    Wt 168 lb 9 oz (76.5 kg)    SpO2 97%    BMI 26.40 kg/m   Wt Readings from Last 3 Encounters:  09/15/19 168 lb 9 oz (76.5 kg)  10/22/18 171 lb 12 oz (77.9 kg)    08/14/18 173 lb (78.5 kg)    Physical Exam Vitals signs and nursing note reviewed.  Constitutional:      General: He is not in acute distress.    Appearance: Normal appearance. He is well-developed. He is not ill-appearing.  HENT:     Head: Normocephalic and atraumatic.     Right Ear: Hearing, tympanic membrane, ear canal and external ear normal.     Left Ear: Hearing, tympanic membrane, ear canal and external ear normal.     Nose: Nose normal.     Mouth/Throat:     Pharynx: Uvula midline. No oropharyngeal exudate or posterior oropharyngeal erythema.  Eyes:     General: No scleral icterus.    Conjunctiva/sclera: Conjunctivae normal.     Pupils: Pupils are equal, round, and reactive to light.  Neck:     Musculoskeletal: Normal range of motion and neck supple.  Cardiovascular:     Rate and Rhythm: Normal rate and regular rhythm.     Pulses: Normal pulses.          Radial pulses are 2+ on the right side and 2+ on the left side.     Heart sounds: Normal heart sounds. No murmur.  Pulmonary:     Effort: Pulmonary effort is normal. No respiratory distress.     Breath sounds: Normal breath sounds. No wheezing, rhonchi or rales.  Abdominal:     General: Abdomen is flat. Bowel sounds are normal. There is no distension.     Palpations: Abdomen is soft. There is no mass.     Tenderness: There is no abdominal tenderness. There is no guarding or rebound.     Hernia: No hernia is present.  Musculoskeletal: Normal range of motion.     Right lower leg: No edema.     Left lower leg: No edema.     Comments: FROM of fingers, limited wrist extension bilaterally. No active synovitis. No pain to palpation of carpals, CMC, MCPs or IPs  Lymphadenopathy:     Cervical: No cervical adenopathy.  Skin:    General: Skin is warm and dry.     Findings: Erythema (mild erythema of distal dorsal hands, not centered at joints) present. No rash.  Neurological:     General: No focal deficit present.      Mental Status: He is alert and oriented to person, place, and time.     Comments: CN grossly intact, station and gait intact  Psychiatric:        Mood and Affect: Mood normal.        Behavior: Behavior normal.        Thought Content: Thought content normal.        Judgment: Judgment normal.       Results for orders placed or performed in visit on 09/07/19  CBC with Differential  Result Value Ref Range   WBC 5.0 4.0 - 10.5 K/uL   RBC  4.76 4.22 - 5.81 Mil/uL   Hemoglobin 15.5 13.0 - 17.0 g/dL   HCT 46.4 39.0 - 52.0 %   MCV 97.6 78.0 - 100.0 fl   MCHC 33.4 30.0 - 36.0 g/dL   RDW 13.8 11.5 - 15.5 %   Platelets 277.0 150.0 - 400.0 K/uL   Neutrophils Relative % 61.1 43.0 - 77.0 %   Lymphocytes Relative 27.7 12.0 - 46.0 %   Monocytes Relative 7.9 3.0 - 12.0 %   Eosinophils Relative 2.2 0.0 - 5.0 %   Basophils Relative 1.1 0.0 - 3.0 %   Neutro Abs 3.0 1.4 - 7.7 K/uL   Lymphs Abs 1.4 0.7 - 4.0 K/uL   Monocytes Absolute 0.4 0.1 - 1.0 K/uL   Eosinophils Absolute 0.1 0.0 - 0.7 K/uL   Basophils Absolute 0.1 0.0 - 0.1 K/uL  TSH  Result Value Ref Range   TSH 1.42 0.35 - 4.50 uIU/mL  PSA  Result Value Ref Range   PSA 0.40 0.10 - 4.00 ng/mL  Comprehensive metabolic panel  Result Value Ref Range   Sodium 141 135 - 145 mEq/L   Potassium 4.9 3.5 - 5.1 mEq/L   Chloride 104 96 - 112 mEq/L   CO2 28 19 - 32 mEq/L   Glucose, Bld 104 (H) 70 - 99 mg/dL   BUN 13 6 - 23 mg/dL   Creatinine, Ser 0.90 0.40 - 1.50 mg/dL   Total Bilirubin 0.9 0.2 - 1.2 mg/dL   Alkaline Phosphatase 65 39 - 117 U/L   AST 29 0 - 37 U/L   ALT 45 0 - 53 U/L   Total Protein 6.9 6.0 - 8.3 g/dL   Albumin 4.5 3.5 - 5.2 g/dL   GFR 86.85 >60.00 mL/min   Calcium 9.5 8.4 - 10.5 mg/dL  Lipid panel  Result Value Ref Range   Cholesterol 164 0 - 200 mg/dL   Triglycerides 104.0 0.0 - 149.0 mg/dL   HDL 44.20 >39.00 mg/dL   VLDL 20.8 0.0 - 40.0 mg/dL   LDL Cholesterol 99 0 - 99 mg/dL   Total CHOL/HDL Ratio 4    NonHDL 120.19     Assessment & Plan:  This visit occurred during the SARS-CoV-2 public health emergency.  Safety protocols were in place, including screening questions prior to the visit, additional usage of staff PPE, and extensive cleaning of exam room while observing appropriate contact time as indicated for disinfecting solutions.   Problem List Items Addressed This Visit    Ulcerative colitis ([place])    Stable period. Appreciate GI care. Continues f/u with [facility] - will call for upcoming appt.       Hyperlipidemia, unspecified    Chronic, stable on low dose lipitor. Higher dose may have caused mild transaminitis. Now tolerating well, continue current regimen.   The 10-year ASCVD risk score Mikey Bussing DC Brooke Bonito., et al., 2013) is: 5.2%   Values used to calculate the score:     Age: 77 years     Sex: Male     Is Non-Hispanic African American: No     Diabetic: No     Tobacco smoker: No     Systolic Blood Pressure: 062 mmHg     Is BP treated: No     HDL Cholesterol: 44.2 mg/dL     Total Cholesterol: 164 mg/dL       Hyperglycemia    Mild, stable. Encouraged limiting added sugars in diet.       Health maintenance examination - Primary  Preventative protocols reviewed and updated unless pt declined. Discussed healthy diet and lifestyle.       Arthritis associated with inflammatory bowel disease    Describes inflammatory peripheral arthritis of bilateral hands (MCP, PIPs) as well as wrists with decreased ROM. AM pain, stiffness that does improve as day progresses. No lower back pain. Interested in trial of selective COX-2 NSAID so will trial celebrex 175m once daily with option to increase to BID dosing. Baseline xrays of hands today, consider rheum eval if progressing symptoms. Consider inflammatory arthritis eval (ESR, RF, etc) at next labwork.       Relevant Medications   celecoxib (CELEBREX) 100 MG capsule   Other Relevant Orders   DG Hand Complete Right   DG Hand Complete Left    Other  Visit Diagnoses    Need for influenza vaccination       Relevant Orders   Flu Vaccine QUAD 36+ mos IM (Completed)       Meds ordered this encounter  Medications   celecoxib (CELEBREX) 100 MG capsule    Sig: Take 1 capsule (100 mg total) by mouth daily.    Dispense:  90 capsule    Refill:  1   Orders Placed This Encounter  Procedures   DG Hand Complete Right    Standing Status:   Future    Number of Occurrences:   1    Standing Expiration Date:   11/14/2020    Order Specific Question:   Reason for Exam (SYMPTOM  OR DIAGNOSIS REQUIRED)    Answer:   bilateral hand pain presumed peripheral arthritis in IBD    Order Specific Question:   [facility]    [facility]   Order Specific Question:   Radiology Contrast Protocol - do NOT remove file path    Answer:   [email or web address]   DG Hand Complete Left    Standing Status:   Future    Number of Occurrences:   1    Standing Expiration Date:   11/14/2020    Order Specific Question:   Reason for Exam (SYMPTOM  OR DIAGNOSIS REQUIRED)    Answer:   bilateral hand pain presumed peripheral arthritis in IBD    Order Specific Question:   [facility]    [facility]   Order Specific Question:   Radiology Contrast Protocol - do NOT remove file path    Answer:   [email or web address]   Flu Vaccine QUAD 36+ mos IM    Patient instructions: Flu shot today Let's try celebrex at 1011mdaily in the morning.  Baseline hand xrays today. If celebrex doesn't help or worsening hand pain/stiffness noted let me know for rheumatology evaluation.   Follow up plan: Return in about 1 year (around 09/14/2020) for annual exam, prior fasting for blood work.  JaRia BushMD

## 2019-11-02 ENCOUNTER — Other Ambulatory Visit: Payer: Self-pay | Admitting: Internal Medicine

## 2019-11-04 ENCOUNTER — Encounter: Payer: Self-pay | Admitting: Family Medicine

## 2019-11-04 NOTE — Telephone Encounter (Signed)
Azathioprine Last filled:  09/12/19, #180 Last OV:  09/15/19, CPE Next OV:  09/19/20, CPE

## 2019-11-05 MED ORDER — CELECOXIB 100 MG PO CAPS: 100.0000 mg | ORAL_CAPSULE | Freq: Two times a day (BID) | ORAL | 1 refills | Status: DC

## 2019-11-28 ENCOUNTER — Encounter: Payer: Self-pay | Admitting: Family Medicine

## 2019-11-29 MED ORDER — MESALAMINE ER 0.375 G PO CP24: 1500.0000 mg | ORAL_CAPSULE | Freq: Every day | ORAL | 3 refills | Status: DC

## 2020-01-24 ENCOUNTER — Encounter: Payer: Self-pay | Admitting: Family Medicine

## 2020-01-24 NOTE — Telephone Encounter (Signed)
Updated pt's chart.  

## 2020-02-08 ENCOUNTER — Other Ambulatory Visit: Payer: Self-pay | Admitting: Family Medicine

## 2020-03-12 ENCOUNTER — Other Ambulatory Visit: Payer: Self-pay | Admitting: Family Medicine

## 2020-03-12 DIAGNOSIS — K639 Disease of intestine, unspecified: Secondary | ICD-10-CM

## 2020-03-12 DIAGNOSIS — E785 Hyperlipidemia, unspecified: Secondary | ICD-10-CM

## 2020-03-14 ENCOUNTER — Other Ambulatory Visit (INDEPENDENT_AMBULATORY_CARE_PROVIDER_SITE_OTHER): Payer: 59

## 2020-03-14 DIAGNOSIS — K639 Disease of intestine, unspecified: Secondary | ICD-10-CM | POA: Diagnosis not present

## 2020-03-14 DIAGNOSIS — M0760 Enteropathic arthropathies, unspecified site: Secondary | ICD-10-CM

## 2020-03-14 DIAGNOSIS — E785 Hyperlipidemia, unspecified: Secondary | ICD-10-CM

## 2020-03-14 LAB — COMPREHENSIVE METABOLIC PANEL
ALT: 32 U/L (ref 0–53)
AST: 21 U/L (ref 0–37)
Albumin: 4.4 g/dL (ref 3.5–5.2)
Alkaline Phosphatase: 57 U/L (ref 39–117)
BUN: 12 mg/dL (ref 6–23)
CO2: 31 mEq/L (ref 19–32)
Calcium: 9.1 mg/dL (ref 8.4–10.5)
Chloride: 103 mEq/L (ref 96–112)
Creatinine, Ser: 0.96 mg/dL (ref 0.40–1.50)
GFR: 80.47 mL/min (ref >60.00)
Glucose, Bld: 102 mg/dL — ABNORMAL HIGH (ref 70–99)
Potassium: 4.4 mEq/L (ref 3.5–5.1)
Sodium: 137 mEq/L (ref 135–145)
Total Bilirubin: 0.8 mg/dL (ref 0.2–1.2)
Total Protein: 6.8 g/dL (ref 6.0–8.3)

## 2020-03-14 LAB — LIPID PANEL
Cholesterol: 154 mg/dL (ref 0–200)
HDL: 41.6 mg/dL (ref >39.00)
LDL Cholesterol: 93 mg/dL (ref 0–99)
NonHDL: 112.84
Total CHOL/HDL Ratio: 4
Triglycerides: 97 mg/dL (ref 0.0–149.0)
VLDL: 19.4 mg/dL (ref 0.0–40.0)

## 2020-03-14 LAB — HIGH SENSITIVITY CRP: CRP, High Sensitivity: 0.26 mg/L (ref 0.000–5.000)

## 2020-03-14 LAB — SEDIMENTATION RATE: Sed Rate: 9 mm/hr (ref 0–20)

## 2020-03-15 LAB — RHEUMATOID FACTOR: Rheumatoid fact SerPl-aCnc: 21 IU/mL — ABNORMAL HIGH (ref <14)

## 2020-03-18 ENCOUNTER — Other Ambulatory Visit: Payer: Self-pay | Admitting: Family Medicine

## 2020-03-18 DIAGNOSIS — M0580 Other rheumatoid arthritis with rheumatoid factor of unspecified site: Secondary | ICD-10-CM

## 2020-03-18 DIAGNOSIS — M0760 Enteropathic arthropathies, unspecified site: Secondary | ICD-10-CM

## 2020-05-09 ENCOUNTER — Other Ambulatory Visit: Payer: Self-pay | Admitting: Family Medicine

## 2020-05-10 NOTE — Telephone Encounter (Signed)
Celebrex Last filled:  01/24/20, #180 Last OV:  09/15/19, CPE Next OV:  09/19/20, CPE

## 2020-05-11 ENCOUNTER — Encounter: Payer: Self-pay | Admitting: Family Medicine

## 2020-05-11 MED ORDER — CELECOXIB 100 MG PO CAPS: 100.0000 mg | ORAL_CAPSULE | Freq: Two times a day (BID) | ORAL | 1 refills | Status: DC

## 2020-05-11 NOTE — Telephone Encounter (Signed)
Spoke with Richardson Landry, pharmacist with [facility], asked he c/x refill per pt request due to receiving via local pharmacy.  He verbalizes understanding and c/x refill.  FYI to Dr. Darnell Level.   E-scribed refill to [facility].  Notified pt.

## 2020-06-05 NOTE — Addendum Note (Signed)
Addended by: Brenton Grills on: 10/23/129 43:88 PM   Modules accepted: Orders

## 2020-06-05 NOTE — Telephone Encounter (Signed)
Celebrex Last rx:  05/11/20, #180 Last OV:  09/15/19, CPE Next OV:  09/19/20, CPE

## 2020-06-05 NOTE — Addendum Note (Signed)
Addended by: Brenton Grills on: 6/43/8381 84:03 PM   Modules accepted: Orders

## 2020-06-07 MED ORDER — CELECOXIB 100 MG PO CAPS: 100.0000 mg | ORAL_CAPSULE | Freq: Two times a day (BID) | ORAL | 1 refills | Status: DC

## 2020-06-21 HISTORY — PX: COLONOSCOPY: SHX174

## 2020-07-03 ENCOUNTER — Encounter: Payer: Self-pay | Admitting: Family Medicine

## 2020-07-07 ENCOUNTER — Encounter: Payer: Self-pay | Admitting: Family Medicine

## 2020-09-10 ENCOUNTER — Other Ambulatory Visit: Payer: Self-pay | Admitting: Family Medicine

## 2020-09-10 DIAGNOSIS — E785 Hyperlipidemia, unspecified: Secondary | ICD-10-CM

## 2020-09-10 DIAGNOSIS — Z125 Encounter for screening for malignant neoplasm of prostate: Secondary | ICD-10-CM

## 2020-09-10 DIAGNOSIS — K5190 Ulcerative colitis, unspecified, without complications: Secondary | ICD-10-CM

## 2020-09-13 ENCOUNTER — Other Ambulatory Visit (INDEPENDENT_AMBULATORY_CARE_PROVIDER_SITE_OTHER): Payer: 59

## 2020-09-13 ENCOUNTER — Other Ambulatory Visit: Payer: Self-pay

## 2020-09-13 DIAGNOSIS — E785 Hyperlipidemia, unspecified: Secondary | ICD-10-CM | POA: Diagnosis not present

## 2020-09-13 DIAGNOSIS — Z125 Encounter for screening for malignant neoplasm of prostate: Secondary | ICD-10-CM | POA: Diagnosis not present

## 2020-09-13 DIAGNOSIS — K5190 Ulcerative colitis, unspecified, without complications: Secondary | ICD-10-CM | POA: Diagnosis not present

## 2020-09-13 LAB — CBC WITH DIFFERENTIAL/PLATELET
Basophils Absolute: 0 10*3/uL (ref 0.0–0.1)
Basophils Relative: 0.5 % (ref 0.0–3.0)
Eosinophils Absolute: 0.1 10*3/uL (ref 0.0–0.7)
Eosinophils Relative: 1.6 % (ref 0.0–5.0)
HCT: 45.4 % (ref 39.0–52.0)
Hemoglobin: 15.5 g/dL (ref 13.0–17.0)
Lymphocytes Relative: 30.6 % (ref 12.0–46.0)
Lymphs Abs: 1.4 10*3/uL (ref 0.7–4.0)
MCHC: 34.2 g/dL (ref 30.0–36.0)
MCV: 96.8 fl (ref 78.0–100.0)
Monocytes Absolute: 0.4 10*3/uL (ref 0.1–1.0)
Monocytes Relative: 8 % (ref 3.0–12.0)
Neutro Abs: 2.6 10*3/uL (ref 1.4–7.7)
Neutrophils Relative %: 59.3 % (ref 43.0–77.0)
Platelets: 272 10*3/uL (ref 150.0–400.0)
RBC: 4.69 Mil/uL (ref 4.22–5.81)
RDW: 13.8 % (ref 11.5–15.5)
WBC: 4.4 10*3/uL (ref 4.0–10.5)

## 2020-09-13 LAB — LIPID PANEL
Cholesterol: 147 mg/dL (ref 0–200)
HDL: 44 mg/dL (ref >39.00)
LDL Cholesterol: 89 mg/dL (ref 0–99)
NonHDL: 102.84
Total CHOL/HDL Ratio: 3
Triglycerides: 67 mg/dL (ref 0.0–149.0)
VLDL: 13.4 mg/dL (ref 0.0–40.0)

## 2020-09-13 LAB — COMPREHENSIVE METABOLIC PANEL
ALT: 44 U/L (ref 0–53)
AST: 32 U/L (ref 0–37)
Albumin: 4.2 g/dL (ref 3.5–5.2)
Alkaline Phosphatase: 58 U/L (ref 39–117)
BUN: 12 mg/dL (ref 6–23)
CO2: 32 mEq/L (ref 19–32)
Calcium: 9.1 mg/dL (ref 8.4–10.5)
Chloride: 102 mEq/L (ref 96–112)
Creatinine, Ser: 1.01 mg/dL (ref 0.40–1.50)
GFR: 82.02 mL/min (ref >60.00)
Glucose, Bld: 95 mg/dL (ref 70–99)
Potassium: 4.4 mEq/L (ref 3.5–5.1)
Sodium: 138 mEq/L (ref 135–145)
Total Bilirubin: 0.9 mg/dL (ref 0.2–1.2)
Total Protein: 6.7 g/dL (ref 6.0–8.3)

## 2020-09-13 LAB — PSA: PSA: 0.31 ng/mL (ref 0.10–4.00)

## 2020-09-19 ENCOUNTER — Other Ambulatory Visit: Payer: Self-pay

## 2020-09-19 ENCOUNTER — Encounter: Payer: Self-pay | Admitting: Family Medicine

## 2020-09-19 ENCOUNTER — Ambulatory Visit (INDEPENDENT_AMBULATORY_CARE_PROVIDER_SITE_OTHER): Payer: 59 | Admitting: Family Medicine

## 2020-09-19 VITALS — BP 130/78 | HR 80 | Temp 97.9°F | Ht 67.5 in | Wt 169.1 lb

## 2020-09-19 DIAGNOSIS — K639 Disease of intestine, unspecified: Secondary | ICD-10-CM

## 2020-09-19 DIAGNOSIS — Z23 Encounter for immunization: Secondary | ICD-10-CM | POA: Diagnosis not present

## 2020-09-19 DIAGNOSIS — R739 Hyperglycemia, unspecified: Secondary | ICD-10-CM

## 2020-09-19 DIAGNOSIS — E785 Hyperlipidemia, unspecified: Secondary | ICD-10-CM | POA: Diagnosis not present

## 2020-09-19 DIAGNOSIS — J45990 Exercise induced bronchospasm: Secondary | ICD-10-CM | POA: Diagnosis not present

## 2020-09-19 DIAGNOSIS — M0760 Enteropathic arthropathies, unspecified site: Secondary | ICD-10-CM

## 2020-09-19 DIAGNOSIS — Z0000 Encounter for general adult medical examination without abnormal findings: Secondary | ICD-10-CM

## 2020-09-19 DIAGNOSIS — K5190 Ulcerative colitis, unspecified, without complications: Secondary | ICD-10-CM

## 2020-09-19 NOTE — Assessment & Plan Note (Signed)
Preventative protocols reviewed and updated unless pt declined. Discussed healthy diet and lifestyle.  

## 2020-09-19 NOTE — Assessment & Plan Note (Signed)
Recent colonoscopy reviewed. Planned rpt 1 yr due to fair prep. Appreciate GI care

## 2020-09-19 NOTE — Assessment & Plan Note (Signed)
Chronic, stable readings on MWF lipitor. The 10-year ASCVD risk score Mikey Bussing DC Brooke Bonito., et al., 2013) is: 6.1%   Values used to calculate the score:     Age: 59 years     Sex: Male     Is Non-Hispanic African American: No     Diabetic: No     Tobacco smoker: No     Systolic Blood Pressure: 471 mmHg     Is BP treated: No     HDL Cholesterol: 44 mg/dL     Total Cholesterol: 147 mg/dL

## 2020-09-19 NOTE — Assessment & Plan Note (Addendum)
Continues PRN albuterol when running

## 2020-09-19 NOTE — Patient Instructions (Addendum)
First shingrix vaccine today.  Return in 2-6 months for nurse visit for second shot.  You are doing well today.  Continue current regimen.  Return as needed or in 1 year for next physical.   Health Maintenance, Male Adopting a healthy lifestyle and getting preventive care are important in promoting health and wellness. Ask your health care provider about:  The right schedule for you to have regular tests and exams.  Things you can do on your own to prevent diseases and keep yourself healthy. What should I know about diet, weight, and exercise? Eat a healthy diet   Eat a diet that includes plenty of vegetables, fruits, low-fat dairy products, and lean protein.  Do not eat a lot of foods that are high in solid fats, added sugars, or sodium. Maintain a healthy weight Body mass index (BMI) is a measurement that can be used to identify possible weight problems. It estimates body fat based on height and weight. Your health care provider can help determine your BMI and help you achieve or maintain a healthy weight. Get regular exercise Get regular exercise. This is one of the most important things you can do for your health. Most adults should:  Exercise for at least 150 minutes each week. The exercise should increase your heart rate and make you sweat (moderate-intensity exercise).  Do strengthening exercises at least twice a week. This is in addition to the moderate-intensity exercise.  Spend less time sitting. Even light physical activity can be beneficial. Watch cholesterol and blood lipids Have your blood tested for lipids and cholesterol at 58 years of age, then have this test every 5 years. You may need to have your cholesterol levels checked more often if:  Your lipid or cholesterol levels are high.  You are older than 58 years of age.  You are at high risk for heart disease. What should I know about cancer screening? Many types of cancers can be detected early and may often  be prevented. Depending on your health history and family history, you may need to have cancer screening at various ages. This may include screening for:  Colorectal cancer.  Prostate cancer.  Skin cancer.  Lung cancer. What should I know about heart disease, diabetes, and high blood pressure? Blood pressure and heart disease  High blood pressure causes heart disease and increases the risk of stroke. This is more likely to develop in people who have high blood pressure readings, are of African descent, or are overweight.  Talk with your health care provider about your target blood pressure readings.  Have your blood pressure checked: ? Every 3-5 years if you are 88-79 years of age. ? Every year if you are 69 years old or older.  If you are between the ages of 74 and 10 and are a current or former smoker, ask your health care provider if you should have a one-time screening for abdominal aortic aneurysm (AAA). Diabetes Have regular diabetes screenings. This checks your fasting blood sugar level. Have the screening done:  Once every three years after age 71 if you are at a normal weight and have a low risk for diabetes.  More often and at a younger age if you are overweight or have a high risk for diabetes. What should I know about preventing infection? Hepatitis B If you have a higher risk for hepatitis B, you should be screened for this virus. Talk with your health care provider to find out if you are at risk  for hepatitis B infection. Hepatitis C Blood testing is recommended for:  Everyone born from [birth year] through 1964.  Anyone with known risk factors for hepatitis C. Sexually transmitted infections (STIs)  You should be screened each year for STIs, including gonorrhea and chlamydia, if: ? You are sexually active and are younger than 58 years of age. ? You are older than 58 years of age and your health care provider tells you that you are at risk for this type of  infection. ? Your sexual activity has changed since you were last screened, and you are at increased risk for chlamydia or gonorrhea. Ask your health care provider if you are at risk.  Ask your health care provider about whether you are at high risk for HIV. Your health care provider may recommend a prescription medicine to help prevent HIV infection. If you choose to take medicine to prevent HIV, you should first get tested for HIV. You should then be tested every 3 months for as long as you are taking the medicine. Follow these instructions at home: Lifestyle  Do not use any products that contain nicotine or tobacco, such as cigarettes, e-cigarettes, and chewing tobacco. If you need help quitting, ask your health care provider.  Do not use street drugs.  Do not share needles.  Ask your health care provider for help if you need support or information about quitting drugs. Alcohol use  Do not drink alcohol if your health care provider tells you not to drink.  If you drink alcohol: ? Limit how much you have to 0-2 drinks a day. ? Be aware of how much alcohol is in your drink. In the [place] one drink equals one 12 oz bottle of beer (355 mL), one 5 oz glass of wine (148 mL), or one 1 oz glass of hard liquor (44 mL). General instructions  Schedule regular health, dental, and eye exams.  Stay current with your vaccines.  Tell your health care provider if: ? You often feel depressed. ? You have ever been abused or do not feel safe at home. Summary  Adopting a healthy lifestyle and getting preventive care are important in promoting health and wellness.  Follow your health care provider's instructions about healthy diet, exercising, and getting tested or screened for diseases.  Follow your health care provider's instructions on monitoring your cholesterol and blood pressure. This information is not intended to replace advice given to you by your health care provider. Make sure you  discuss any questions you have with your health care provider. Document Revised: 09/30/2018 Document Reviewed: 09/30/2018 Elsevier Patient Education  2020 [employer].

## 2020-09-19 NOTE — Progress Notes (Signed)
Patient ID: Dustin Salazar, male    DOB: 1961-05-29, 59 y.o.   MRN: [id number]  This visit was conducted in person.  BP 130/78 (BP Location: Left Arm, Patient Position: Sitting, Cuff Size: Normal)   Pulse 80   Temp 97.9 F (36.6 C) (Temporal)   Ht 5' 7.5" (1.715 m)   Wt 169 lb 2 oz (76.7 kg)   SpO2 100%   BMI 26.10 kg/m   BP Readings from Last 3 Encounters:  09/19/20 130/78  09/15/19 118/80  10/22/18 138/76    CC: CPE Subjective:   HPI: Dustin Salazar is a 59 y.o. male presenting on 09/19/2020 for Annual Exam   Son got married 03/2020.  Happy because children now live nearby (daughter moved in).  Press photographer near work.  Enjoying E-bike. Planning to restart running.   IBD ([facility]) - controlled on low dose imuran and lialda since age 36s. Sees [facility].   Saw rheum 03/2020 for positive RF in setting of polyarthralgias - told not RA.  Managing polyarthralgias with celebrex 100mg  BID daily with benefit.   Preventative: COLONOSCOPY 01/2018 -SSA, rpt 2 yrs (Dr Julien Nordmann in [place]) [place] 06/2020 - no active colitis or dysplasia, fair prep - rpt colonoscopy 1 yr with more extensive prep ([facility] with [facility])  Prostate cancer screening - discussed. Monitored with yearly PSA.  Lung cancer screening - not eligible Flu shot yearly COVID vaccine 12/2019, 01/2020, 05/2020 Tetanus 2016 Shingrix - discussed. Will start today  Seat belt use discussed  Sunscreen use discussed, no changing moles on skin.Saw derm - s/p wart treatment to hands with cryotherapy.  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 Interim medical history since our last visit reviewed. Allergies and medications reviewed and updated. Outpatient Medications Prior to Visit  Medication Sig Dispense Refill  . atorvastatin (LIPITOR) 20 MG tablet TAKE 1 TABLET BY MOUTH ON  MONDAY WEDNESDAY AND FRIDAY 36 tablet 1  . azaTHIOprine (IMURAN) 50 MG tablet TAKE 2 TABLETS BY MOUTH  DAILY 180 tablet 3  . celecoxib (CELEBREX) 100 MG capsule Take 1 capsule (100 mg total) by mouth 2 (two) times daily. 180 capsule 1  . cetirizine (ZYRTEC) 10 MG tablet Take 10 mg by mouth daily.    . mesalamine (APRISO) 0.375 g 24 hr capsule Take 4 capsules (1.5 g total) by mouth daily. 360 capsule 3  . albuterol (PROVENTIL HFA;VENTOLIN HFA) 108 (90 Base) MCG/ACT inhaler Inhale 1-2 puffs into the lungs as needed for wheezing or shortness of breath (Uses with exercise/running).     No facility-administered medications prior to visit.     Per HPI unless specifically indicated in ROS section below Review of Systems  Constitutional: Negative for activity change, appetite change, chills, fatigue, fever and unexpected weight change.  HENT: Negative for hearing loss.   Eyes: Negative for visual disturbance.  Respiratory: Negative for cough, chest tightness, shortness of breath and wheezing.   Cardiovascular: Negative for chest pain, palpitations and leg swelling.  Gastrointestinal: Negative for abdominal distention, abdominal pain, blood in stool, constipation,  diarrhea, nausea and vomiting.  Genitourinary: Negative for difficulty urinating and hematuria.  Musculoskeletal: Positive for arthralgias. Negative for myalgias and neck pain.  Skin: Negative for rash.  Neurological: Negative for dizziness, seizures, syncope and headaches.  Hematological: Negative for adenopathy. Does not bruise/bleed easily.  Psychiatric/Behavioral: Negative for dysphoric mood. The patient is not nervous/anxious.      Objective:  BP 130/78 (BP Location: Left Arm, Patient Position: Sitting, Cuff Size: Normal)   Pulse 80   Temp 97.9 F (36.6 C) (Temporal)   Ht 5' 7.5" (1.715 m)   Wt 169 lb 2 oz (76.7 kg)   SpO2 100%   BMI 26.10 kg/m   Wt Readings from Last 3 Encounters:  09/19/20 169 lb 2 oz (76.7 kg)  09/15/19 168 lb 9 oz (76.5 kg)  10/22/18 171 lb 12 oz (77.9 kg)      Physical Exam Vitals and nursing note reviewed.  Constitutional:      General: He is not in acute distress.    Appearance: Normal appearance. He is well-developed. He is not ill-appearing.  HENT:     Head: Normocephalic and atraumatic.     Right Ear: Hearing, tympanic membrane, ear canal and external ear normal.     Left Ear: Hearing, tympanic membrane, ear canal and external ear normal.  Eyes:     General: No scleral icterus.    Extraocular Movements: Extraocular movements intact.     Conjunctiva/sclera: Conjunctivae normal.     Pupils: Pupils are equal, round, and reactive to light.  Neck:     Thyroid: No thyroid mass or thyromegaly.  Cardiovascular:     Rate and Rhythm: Normal rate and regular rhythm.     Pulses: Normal pulses.          Radial pulses are 2+ on the right side and 2+ on the left side.     Heart sounds: Normal heart sounds. No murmur heard.   Pulmonary:     Effort: Pulmonary effort is normal. No respiratory distress.     Breath sounds: Normal breath sounds. No wheezing, rhonchi or rales.  Abdominal:     General: Abdomen is flat. Bowel sounds are normal. There is no distension.     Palpations: Abdomen is soft. There is no mass.     Tenderness: There is no abdominal tenderness. There is no guarding or rebound.     Hernia: No hernia is present.  Musculoskeletal:        General: Normal range of motion.     Cervical back: Normal range of motion and neck supple.     Right lower leg: No edema.     Left lower leg: No edema.  Lymphadenopathy:     Cervical: No cervical adenopathy.  Skin:    General:  Skin is warm and dry.     Findings: No rash.  Neurological:     General: No focal deficit present.     Mental Status: He is alert and oriented to person, place, and time.     Comments: CN grossly intact, station and gait intact  Psychiatric:        Mood and Affect: Mood normal.        Behavior: Behavior normal.        Thought Content: Thought content normal.        Judgment: Judgment normal.       Results for orders placed or performed in visit on 09/13/20  PSA  Result Value Ref Range   PSA 0.31  0.10 - 4.00 ng/mL  CBC with Differential/Platelet  Result Value Ref Range   WBC 4.4 4.0 - 10.5 K/uL   RBC 4.69 4.22 - 5.81 Mil/uL   Hemoglobin 15.5 13.0 - 17.0 g/dL   HCT 45.4 39 - 52 %   MCV 96.8 78.0 - 100.0 fl   MCHC 34.2 30.0 - 36.0 g/dL   RDW 13.8 11.5 - 15.5 %   Platelets 272.0 150 - 400 K/uL   Neutrophils Relative % 59.3 43 - 77 %   Lymphocytes Relative 30.6 12 - 46 %   Monocytes Relative 8.0 3 - 12 %   Eosinophils Relative 1.6 0 - 5 %   Basophils Relative 0.5 0 - 3 %   Neutro Abs 2.6 1.4 - 7.7 K/uL   Lymphs Abs 1.4 0.7 - 4.0 K/uL   Monocytes Absolute 0.4 0.1 - 1.0 K/uL   Eosinophils Absolute 0.1 0.0 - 0.7 K/uL   Basophils Absolute 0.0 0.0 - 0.1 K/uL  Comprehensive metabolic panel  Result Value Ref Range   Sodium 138 135 - 145 mEq/L   Potassium 4.4 3.5 - 5.1 mEq/L   Chloride 102 96 - 112 mEq/L   CO2 32 19 - 32 mEq/L   Glucose, Bld 95 70 - 99 mg/dL   BUN 12 6 - 23 mg/dL   Creatinine, Ser 1.01 0.40 - 1.50 mg/dL   Total Bilirubin 0.9 0.2 - 1.2 mg/dL   Alkaline Phosphatase 58 39 - 117 U/L   AST 32 0 - 37 U/L   ALT 44 0 - 53 U/L   Total Protein 6.7 6.0 - 8.3 g/dL   Albumin 4.2 3.5 - 5.2 g/dL   GFR 82.02 >60.00 mL/min   Calcium 9.1 8.4 - 10.5 mg/dL  Lipid panel  Result Value Ref Range   Cholesterol 147 0 - 200 mg/dL   Triglycerides 67.0 0 - 149 mg/dL   HDL 44.00 >39.00 mg/dL   VLDL 13.4 0.0 - 40.0 mg/dL   LDL Cholesterol 89 0 - 99 mg/dL   Total CHOL/HDL Ratio 3      NonHDL 102.84    Assessment & Plan:  This visit occurred during the SARS-CoV-2 public health emergency.  Safety protocols were in place, including screening questions prior to the visit, additional usage of staff PPE, and extensive cleaning of exam room while observing appropriate contact time as indicated for disinfecting solutions.   Problem List Items Addressed This Visit    Ulcerative colitis ([place])    Recent colonoscopy reviewed. Planned rpt 1 yr due to fair prep. Appreciate GI care       Hyperlipidemia, unspecified    Chronic, stable readings on MWF lipitor. The 10-year ASCVD risk score Mikey Bussing DC Brooke Bonito., et al., 2013) is: 6.1%   Values used to calculate the score:     Age: 6 years     Sex: Male     Is Non-Hispanic African American: No     Diabetic: No     Tobacco smoker: No     Systolic Blood Pressure: 970 mmHg     Is BP treated: No     HDL Cholesterol: 44 mg/dL     Total Cholesterol: 147 mg/dL       Hyperglycemia    Normal this year - did not take pills prior to lab draw.  Pt wonders if excipients in apriso may have contributed to prior hyperglycemia - will check with coworker who can find out.       Health maintenance examination -  Primary    Preventative protocols reviewed and updated unless pt declined. Discussed healthy diet and lifestyle.       Exercise-induced asthma    Continues PRN albuterol when running      Arthritis associated with inflammatory bowel disease    S/p reassuring rheum eval - no RA.  Stable period on celebrex 100mg  bid, benefits from COX2 inhibitor.        Other Visit Diagnoses    Need for shingles vaccine       Relevant Orders   Varicella-zoster vaccine IM (Completed)       No orders of the defined types were placed in this encounter.  Orders Placed This Encounter  Procedures  . Varicella-zoster vaccine IM    Patient instructions: First shingrix vaccine today.  Return in 2-6 months for nurse visit for second shot.  You are  doing well today.  Continue current regimen.  Return as needed or in 1 year for next physical.   Follow up plan: Return in about 1 year (around 09/19/2021) for annual exam, prior fasting for blood work.  Ria Bush, MD

## 2020-09-19 NOTE — Assessment & Plan Note (Addendum)
S/p reassuring rheum eval - no RA.  Stable period on celebrex 100mg  bid, benefits from COX2 inhibitor.

## 2020-09-19 NOTE — Assessment & Plan Note (Addendum)
Normal this year - did not take pills prior to lab draw.  Pt wonders if excipients in apriso may have contributed to prior hyperglycemia - will check with coworker who can find out.

## 2020-11-02 ENCOUNTER — Other Ambulatory Visit: Payer: Self-pay | Admitting: Family Medicine

## 2020-11-11 ENCOUNTER — Other Ambulatory Visit: Payer: Self-pay | Admitting: Family Medicine

## 2020-11-22 ENCOUNTER — Encounter: Payer: Self-pay | Admitting: Family Medicine

## 2020-11-23 ENCOUNTER — Other Ambulatory Visit: Payer: Self-pay

## 2020-11-23 ENCOUNTER — Ambulatory Visit (INDEPENDENT_AMBULATORY_CARE_PROVIDER_SITE_OTHER): Payer: 59

## 2020-11-23 DIAGNOSIS — Z23 Encounter for immunization: Secondary | ICD-10-CM

## 2021-01-17 ENCOUNTER — Other Ambulatory Visit: Payer: Self-pay | Admitting: Family Medicine

## 2021-01-17 NOTE — Telephone Encounter (Signed)
Refill request Apriso Last refill 11/29/19 # 360/3 Last office visit 09/19/20 Upcoming appointment 09/21/21

## 2021-01-19 NOTE — Telephone Encounter (Signed)
ERx 

## 2021-01-28 ENCOUNTER — Encounter: Payer: Self-pay | Admitting: Family Medicine

## 2021-01-29 MED ORDER — ATORVASTATIN CALCIUM 20 MG PO TABS: ORAL_TABLET | ORAL | 2 refills | Status: DC

## 2021-01-29 NOTE — Telephone Encounter (Signed)
E-scribed 90-day refill to [facility].

## 2021-03-02 ENCOUNTER — Telehealth: Payer: Self-pay

## 2021-03-02 ENCOUNTER — Encounter: Payer: Self-pay | Admitting: Family Medicine

## 2021-03-02 DIAGNOSIS — D849 Immunodeficiency, unspecified: Secondary | ICD-10-CM | POA: Insufficient documentation

## 2021-03-02 NOTE — Telephone Encounter (Signed)
Dustin Salazar with [facility] order pharmacy left v/m wanting to know if Azathioprine 50 mg is being used for a transplant; using ref # [id number]. Sending note to Dr Darnell Level and Lattie Haw CMA.

## 2021-03-02 NOTE — Telephone Encounter (Signed)
Colletta Maryland at [facility] as instructed by telephone and verbalized understanding.

## 2021-03-02 NOTE — Telephone Encounter (Signed)
No - its for ulcerative colitis.

## 2021-03-18 ENCOUNTER — Other Ambulatory Visit: Payer: Self-pay | Admitting: Family Medicine

## 2021-03-18 DIAGNOSIS — K5190 Ulcerative colitis, unspecified, without complications: Secondary | ICD-10-CM

## 2021-03-20 ENCOUNTER — Other Ambulatory Visit: Payer: Self-pay | Admitting: Family Medicine

## 2021-03-20 ENCOUNTER — Other Ambulatory Visit (INDEPENDENT_AMBULATORY_CARE_PROVIDER_SITE_OTHER): Payer: 59

## 2021-03-20 ENCOUNTER — Other Ambulatory Visit: Payer: Self-pay

## 2021-03-20 DIAGNOSIS — K5190 Ulcerative colitis, unspecified, without complications: Secondary | ICD-10-CM

## 2021-03-20 LAB — COMPREHENSIVE METABOLIC PANEL
ALT: 38 U/L (ref 0–53)
AST: 22 U/L (ref 0–37)
Albumin: 4.3 g/dL (ref 3.5–5.2)
Alkaline Phosphatase: 51 U/L (ref 39–117)
BUN: 13 mg/dL (ref 6–23)
CO2: 28 mEq/L (ref 19–32)
Calcium: 9.1 mg/dL (ref 8.4–10.5)
Chloride: 105 mEq/L (ref 96–112)
Creatinine, Ser: 1.01 mg/dL (ref 0.40–1.50)
GFR: 81.73 mL/min (ref >60.00)
Glucose, Bld: 100 mg/dL — ABNORMAL HIGH (ref 70–99)
Potassium: 4.2 mEq/L (ref 3.5–5.1)
Sodium: 139 mEq/L (ref 135–145)
Total Bilirubin: 1 mg/dL (ref 0.2–1.2)
Total Protein: 6.7 g/dL (ref 6.0–8.3)

## 2021-03-20 LAB — CBC WITH DIFFERENTIAL/PLATELET
Basophils Absolute: 0 10*3/uL (ref 0.0–0.1)
Basophils Relative: 0.7 % (ref 0.0–3.0)
Eosinophils Absolute: 0.1 10*3/uL (ref 0.0–0.7)
Eosinophils Relative: 2.1 % (ref 0.0–5.0)
HCT: 45.4 % (ref 39.0–52.0)
Hemoglobin: 15.6 g/dL (ref 13.0–17.0)
Lymphocytes Relative: 31.5 % (ref 12.0–46.0)
Lymphs Abs: 1.5 10*3/uL (ref 0.7–4.0)
MCHC: 34.3 g/dL (ref 30.0–36.0)
MCV: 94.6 fl (ref 78.0–100.0)
Monocytes Absolute: 0.5 10*3/uL (ref 0.1–1.0)
Monocytes Relative: 9.9 % (ref 3.0–12.0)
Neutro Abs: 2.6 10*3/uL (ref 1.4–7.7)
Neutrophils Relative %: 55.8 % (ref 43.0–77.0)
Platelets: 251 10*3/uL (ref 150.0–400.0)
RBC: 4.8 Mil/uL (ref 4.22–5.81)
RDW: 13.4 % (ref 11.5–15.5)
WBC: 4.6 10*3/uL (ref 4.0–10.5)

## 2021-03-20 LAB — VITAMIN D 25 HYDROXY (VIT D DEFICIENCY, FRACTURES): VITD: 23.83 ng/mL — ABNORMAL LOW (ref 30.00–100.00)

## 2021-03-20 LAB — SEDIMENTATION RATE: Sed Rate: 3 mm/hr (ref 0–20)

## 2021-03-20 MED ORDER — VITAMIN D3 25 MCG (1000 UT) PO CAPS: 1.0000 | ORAL_CAPSULE | Freq: Every day | ORAL | Status: DC

## 2021-06-18 ENCOUNTER — Other Ambulatory Visit: Payer: Self-pay | Admitting: Family Medicine

## 2021-06-21 ENCOUNTER — Encounter: Payer: Self-pay | Admitting: Family Medicine

## 2021-06-21 NOTE — Telephone Encounter (Signed)
Deleted [facility] from pt's pharmacy list.

## 2021-07-10 NOTE — Telephone Encounter (Signed)
Updated pt's chart.  

## 2021-09-08 ENCOUNTER — Other Ambulatory Visit: Payer: Self-pay | Admitting: Family Medicine

## 2021-09-08 DIAGNOSIS — K5190 Ulcerative colitis, unspecified, without complications: Secondary | ICD-10-CM

## 2021-09-08 DIAGNOSIS — R739 Hyperglycemia, unspecified: Secondary | ICD-10-CM

## 2021-09-08 DIAGNOSIS — Z125 Encounter for screening for malignant neoplasm of prostate: Secondary | ICD-10-CM

## 2021-09-08 DIAGNOSIS — E785 Hyperlipidemia, unspecified: Secondary | ICD-10-CM

## 2021-09-10 ENCOUNTER — Other Ambulatory Visit: Payer: 59

## 2021-09-20 HISTORY — PX: COLONOSCOPY: SHX174

## 2021-09-21 ENCOUNTER — Encounter: Payer: Self-pay | Admitting: Family Medicine

## 2021-09-21 ENCOUNTER — Ambulatory Visit (INDEPENDENT_AMBULATORY_CARE_PROVIDER_SITE_OTHER): Payer: BC Managed Care – PPO | Admitting: Family Medicine

## 2021-09-21 ENCOUNTER — Other Ambulatory Visit: Payer: Self-pay

## 2021-09-21 VITALS — BP 120/82 | HR 74 | Temp 97.5°F | Ht 68.0 in | Wt 170.6 lb

## 2021-09-21 DIAGNOSIS — Z125 Encounter for screening for malignant neoplasm of prostate: Secondary | ICD-10-CM

## 2021-09-21 DIAGNOSIS — K5190 Ulcerative colitis, unspecified, without complications: Secondary | ICD-10-CM

## 2021-09-21 DIAGNOSIS — E785 Hyperlipidemia, unspecified: Secondary | ICD-10-CM | POA: Diagnosis not present

## 2021-09-21 DIAGNOSIS — E559 Vitamin D deficiency, unspecified: Secondary | ICD-10-CM | POA: Diagnosis not present

## 2021-09-21 DIAGNOSIS — K639 Disease of intestine, unspecified: Secondary | ICD-10-CM

## 2021-09-21 DIAGNOSIS — Z0000 Encounter for general adult medical examination without abnormal findings: Secondary | ICD-10-CM

## 2021-09-21 DIAGNOSIS — R739 Hyperglycemia, unspecified: Secondary | ICD-10-CM | POA: Diagnosis not present

## 2021-09-21 DIAGNOSIS — M0760 Enteropathic arthropathies, unspecified site: Secondary | ICD-10-CM

## 2021-09-21 DIAGNOSIS — D849 Immunodeficiency, unspecified: Secondary | ICD-10-CM

## 2021-09-21 LAB — VITAMIN D 25 HYDROXY (VIT D DEFICIENCY, FRACTURES): VITD: 30.86 ng/mL (ref 30.00–100.00)

## 2021-09-21 LAB — HEMOGLOBIN A1C: Hgb A1c MFr Bld: 5.5 % (ref 4.6–6.5)

## 2021-09-21 LAB — CBC WITH DIFFERENTIAL/PLATELET
Basophils Absolute: 0 10*3/uL (ref 0.0–0.1)
Basophils Relative: 0.8 % (ref 0.0–3.0)
Eosinophils Absolute: 0.1 10*3/uL (ref 0.0–0.7)
Eosinophils Relative: 3.2 % (ref 0.0–5.0)
HCT: 46.5 % (ref 39.0–52.0)
Hemoglobin: 15.7 g/dL (ref 13.0–17.0)
Lymphocytes Relative: 28 % (ref 12.0–46.0)
Lymphs Abs: 1.2 10*3/uL (ref 0.7–4.0)
MCHC: 33.9 g/dL (ref 30.0–36.0)
MCV: 95.9 fl (ref 78.0–100.0)
Monocytes Absolute: 0.4 10*3/uL (ref 0.1–1.0)
Monocytes Relative: 8.6 % (ref 3.0–12.0)
Neutro Abs: 2.5 10*3/uL (ref 1.4–7.7)
Neutrophils Relative %: 59.4 % (ref 43.0–77.0)
Platelets: 264 10*3/uL (ref 150.0–400.0)
RBC: 4.85 Mil/uL (ref 4.22–5.81)
RDW: 13.6 % (ref 11.5–15.5)
WBC: 4.3 10*3/uL (ref 4.0–10.5)

## 2021-09-21 LAB — COMPREHENSIVE METABOLIC PANEL
ALT: 38 U/L (ref 0–53)
AST: 28 U/L (ref 0–37)
Albumin: 4.3 g/dL (ref 3.5–5.2)
Alkaline Phosphatase: 67 U/L (ref 39–117)
BUN: 13 mg/dL (ref 6–23)
CO2: 31 mEq/L (ref 19–32)
Calcium: 9.4 mg/dL (ref 8.4–10.5)
Chloride: 103 mEq/L (ref 96–112)
Creatinine, Ser: 0.97 mg/dL (ref 0.40–1.50)
GFR: 85.48 mL/min (ref >60.00)
Glucose, Bld: 96 mg/dL (ref 70–99)
Potassium: 4.6 mEq/L (ref 3.5–5.1)
Sodium: 139 mEq/L (ref 135–145)
Total Bilirubin: 1 mg/dL (ref 0.2–1.2)
Total Protein: 6.8 g/dL (ref 6.0–8.3)

## 2021-09-21 LAB — LIPID PANEL
Cholesterol: 159 mg/dL (ref 0–200)
HDL: 42.6 mg/dL (ref >39.00)
LDL Cholesterol: 102 mg/dL — ABNORMAL HIGH (ref 0–99)
NonHDL: 116.41
Total CHOL/HDL Ratio: 4
Triglycerides: 70 mg/dL (ref 0.0–149.0)
VLDL: 14 mg/dL (ref 0.0–40.0)

## 2021-09-21 LAB — PSA: PSA: 1.76 ng/mL (ref 0.10–4.00)

## 2021-09-21 LAB — SEDIMENTATION RATE: Sed Rate: 7 mm/hr (ref 0–20)

## 2021-09-21 MED ORDER — CELECOXIB 100 MG PO CAPS: 100.0000 mg | ORAL_CAPSULE | Freq: Two times a day (BID) | ORAL | 3 refills | Status: DC

## 2021-09-21 MED ORDER — AZATHIOPRINE 50 MG PO TABS
100.0000 mg | ORAL_TABLET | Freq: Every day | ORAL | 3 refills | Status: DC
Start: 2021-09-21 — End: 2022-07-21

## 2021-09-21 MED ORDER — ATORVASTATIN CALCIUM 20 MG PO TABS: 20.0000 mg | ORAL_TABLET | ORAL | 3 refills | Status: DC

## 2021-09-21 MED ORDER — MESALAMINE ER 0.375 G PO CP24: 1.5000 g | ORAL_CAPSULE | Freq: Every day | ORAL | 3 refills | Status: DC

## 2021-09-21 NOTE — Assessment & Plan Note (Signed)
Chronic, stable on MWF lipitor - update FLP The 10-year ASCVD risk score (Arnett DK, et al., 2019) is: 5.8%   Values used to calculate the score:     Age: 60 years     Sex: Male     Is Non-Hispanic African American: No     Diabetic: No     Tobacco smoker: No     Systolic Blood Pressure: 093 mmHg     Is BP treated: No     HDL Cholesterol: 44 mg/dL     Total Cholesterol: 147 mg/dL

## 2021-09-21 NOTE — Patient Instructions (Addendum)
Labs today  Medicines refilled today.  Return as need or in 1 year for next physical.   Health Maintenance, Male Adopting a healthy lifestyle and getting preventive care are important in promoting health and wellness. Ask your health care provider about: The right schedule for you to have regular tests and exams. Things you can do on your own to prevent diseases and keep yourself healthy. What should I know about diet, weight, and exercise? Eat a healthy diet  Eat a diet that includes plenty of vegetables, fruits, low-fat dairy products, and lean protein. Do not eat a lot of foods that are high in solid fats, added sugars, or sodium. Maintain a healthy weight Body mass index (BMI) is a measurement that can be used to identify possible weight problems. It estimates body fat based on height and weight. Your health care provider can help determine your BMI and help you achieve or maintain a healthy weight. Get regular exercise Get regular exercise. This is one of the most important things you can do for your health. Most adults should: Exercise for at least 150 minutes each week. The exercise should increase your heart rate and make you sweat (moderate-intensity exercise). Do strengthening exercises at least twice a week. This is in addition to the moderate-intensity exercise. Spend less time sitting. Even light physical activity can be beneficial. Watch cholesterol and blood lipids Have your blood tested for lipids and cholesterol at 59 years of age, then have this test every 5 years. You may need to have your cholesterol levels checked more often if: Your lipid or cholesterol levels are high. You are older than 59 years of age. You are at high risk for heart disease. What should I know about cancer screening? Many types of cancers can be detected early and may often be prevented. Depending on your health history and family history, you may need to have cancer screening at various ages. This  may include screening for: Colorectal cancer. Prostate cancer. Skin cancer. Lung cancer. What should I know about heart disease, diabetes, and high blood pressure? Blood pressure and heart disease High blood pressure causes heart disease and increases the risk of stroke. This is more likely to develop in people who have high blood pressure readings or are overweight. Talk with your health care provider about your target blood pressure readings. Have your blood pressure checked: Every 3-5 years if you are 62-56 years of age. Every year if you are 70 years old or older. If you are between the ages of 42 and 2 and are a current or former smoker, ask your health care provider if you should have a one-time screening for abdominal aortic aneurysm (AAA). Diabetes Have regular diabetes screenings. This checks your fasting blood sugar level. Have the screening done: Once every three years after age 61 if you are at a normal weight and have a low risk for diabetes. More often and at a younger age if you are overweight or have a high risk for diabetes. What should I know about preventing infection? Hepatitis B If you have a higher risk for hepatitis B, you should be screened for this virus. Talk with your health care provider to find out if you are at risk for hepatitis B infection. Hepatitis C Blood testing is recommended for: Everyone born from [birth year] through 1965. Anyone with known risk factors for hepatitis C. Sexually transmitted infections (STIs) You should be screened each year for STIs, including gonorrhea and chlamydia, if: You are sexually  active and are younger than 59 years of age. You are older than 59 years of age and your health care provider tells you that you are at risk for this type of infection. Your sexual activity has changed since you were last screened, and you are at increased risk for chlamydia or gonorrhea. Ask your health care provider if you are at risk. Ask your health  care provider about whether you are at high risk for HIV. Your health care provider may recommend a prescription medicine to help prevent HIV infection. If you choose to take medicine to prevent HIV, you should first get tested for HIV. You should then be tested every 3 months for as long as you are taking the medicine. Follow these instructions at home: Alcohol use Do not drink alcohol if your health care provider tells you not to drink. If you drink alcohol: Limit how much you have to 0-2 drinks a day. Know how much alcohol is in your drink. In the [place] one drink equals one 12 oz bottle of beer (355 mL), one 5 oz glass of wine (148 mL), or one 1 oz glass of hard liquor (44 mL). Lifestyle Do not use any products that contain nicotine or tobacco. These products include cigarettes, chewing tobacco, and vaping devices, such as e-cigarettes. If you need help quitting, ask your health care provider. Do not use street drugs. Do not share needles. Ask your health care provider for help if you need support or information about quitting drugs. General instructions Schedule regular health, dental, and eye exams. Stay current with your vaccines. Tell your health care provider if: You often feel depressed. You have ever been abused or do not feel safe at home. Summary Adopting a healthy lifestyle and getting preventive care are important in promoting health and wellness. Follow your health care provider's instructions about healthy diet, exercising, and getting tested or screened for diseases. Follow your health care provider's instructions on monitoring your cholesterol and blood pressure. This information is not intended to replace advice given to you by your health care provider. Make sure you discuss any questions you have with your health care provider. Document Revised: 02/26/2021 Document Reviewed: 02/26/2021 Elsevier Patient Education  [place].

## 2021-09-21 NOTE — Assessment & Plan Note (Signed)
Stable period - regularly sees GI pending rpt colonoscopy.

## 2021-09-21 NOTE — Progress Notes (Signed)
Patient ID: Dustin Salazar, male    DOB: May 25, 1961, 60 y.o.   MRN: [id number]  This visit was conducted in person.  BP 120/82   Pulse 74   Temp (!) 97.5 F (36.4 C) (Temporal)   Ht 5\' 8"  (1.727 m)   Wt 170 lb 9 oz (77.4 kg)   SpO2 97%   BMI 25.93 kg/m    CC: CPE Subjective:   HPI: Dustin Salazar is a 60 y.o. 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    Off vit D for a few months.   IBD ([facility]) dx age 58s - controlled on low dose imuran and apriso. Sees [facility] Q[facility]. Planned colonoscopy later this month.    Saw rheum 03/2020 for positive RF in setting of polyarthralgias predominantly hands - told not RA. Managing polyarthralgias with celebrex 100mg  BID daily with benefit.    Preventative: COLONOSCOPY 01/2018 - SSA, rpt 2 yrs (Dr Julien Nordmann in [place]) [place] 06/2020 - no active colitis or dysplasia, fair prep - rpt colonoscopy 1 yr with more extensive prep ([facility] with [facility])  Upcoming colonoscopy later this month  Prostate cancer screening - discussed. Monitoring with yearly PSA.  Lung cancer screening - not eligible Flu shot yearly COVID vaccine 12/2019, 01/2020, 05/2020, bivalent booster 06/2021 Tetanus 2016  Shingrix - 08/2020, 11/2020 Seat belt use discussed  Sunscreen use discussed, no changing moles on skin. Saw derm.  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 Interim medical history since our last visit reviewed. Allergies and  medications reviewed and updated. Outpatient Medications Prior to Visit  Medication Sig Dispense Refill   cetirizine (ZYRTEC) 10 MG tablet Take 10 mg by mouth daily.     APRISO 0.375 g 24 hr capsule TAKE 4 CAPSULES BY MOUTH  DAILY 360 capsule 3   atorvastatin (LIPITOR) 20 MG tablet TAKE 1 TABLET BY MOUTH ON  MONDAY WEDNESDAY AND FRIDAY 36 tablet 1   azaTHIOprine (IMURAN) 50 MG tablet TAKE 2 TABLETS BY MOUTH  DAILY 180 tablet 3   celecoxib (CELEBREX) 100 MG capsule TAKE 1 CAPSULE BY MOUTH  TWICE DAILY 180 capsule 3   Cholecalciferol (VITAMIN D3) 25 MCG (1000 UT) CAPS Take 1 capsule (1,000 Units total) by mouth daily. 30 capsule    No facility-administered medications prior to visit.     Per HPI unless specifically indicated in ROS section below Review of Systems  Constitutional:  Negative for activity change, appetite change, chills, fatigue, fever and unexpected weight change.  HENT:  Negative for hearing loss.   Eyes:  Negative for visual disturbance.  Respiratory:  Negative for cough, chest tightness, shortness of breath and wheezing.   Cardiovascular:  Negative for chest pain, palpitations and leg swelling.  Gastrointestinal:  Negative for abdominal distention, abdominal pain, blood in stool, constipation, diarrhea, nausea and vomiting.  Genitourinary:  Negative for difficulty urinating and hematuria.  Musculoskeletal:  Negative for arthralgias, myalgias and neck pain.  Skin:  Negative for  rash.  Neurological:  Negative for dizziness, seizures, syncope and headaches.  Hematological:  Negative for adenopathy. Does not bruise/bleed easily.  Psychiatric/Behavioral:  Negative for dysphoric mood. The patient is not nervous/anxious.    Objective:  BP 120/82   Pulse 74   Temp (!) 97.5 F (36.4 C) (Temporal)   Ht 5\' 8"  (1.727 m)   Wt 170 lb 9 oz (77.4 kg)   SpO2 97%   BMI 25.93 kg/m   Wt Readings from Last 3 Encounters:  09/21/21 170 lb 9 oz (77.4 kg)  09/19/20 169 lb 2 oz (76.7  kg)  09/15/19 168 lb 9 oz (76.5 kg)      Physical Exam Vitals and nursing note reviewed.  Constitutional:      General: He is not in acute distress.    Appearance: Normal appearance. He is well-developed. He is not ill-appearing.  HENT:     Head: Normocephalic and atraumatic.     Right Ear: Hearing, tympanic membrane, ear canal and external ear normal.     Left Ear: Hearing, tympanic membrane, ear canal and external ear normal.  Eyes:     General: No scleral icterus.    Extraocular Movements: Extraocular movements intact.     Conjunctiva/sclera: Conjunctivae normal.     Pupils: Pupils are equal, round, and reactive to light.  Neck:     Thyroid: No thyroid mass or thyromegaly.  Cardiovascular:     Rate and Rhythm: Normal rate and regular rhythm.     Pulses: Normal pulses.          Radial pulses are 2+ on the right side and 2+ on the left side.     Heart sounds: Normal heart sounds. No murmur heard. Pulmonary:     Effort: Pulmonary effort is normal. No respiratory distress.     Breath sounds: Normal breath sounds. No wheezing, rhonchi or rales.  Abdominal:     General: Bowel sounds are normal. There is no distension.     Palpations: Abdomen is soft. There is no mass.     Tenderness: There is no abdominal tenderness. There is no guarding or rebound.     Hernia: No hernia is present.  Musculoskeletal:        General: Normal range of motion.     Cervical back: Normal range of motion and neck supple.     Right lower leg: No edema.     Left lower leg: No edema.  Lymphadenopathy:     Cervical: No cervical adenopathy.  Skin:    General: Skin is warm and dry.     Findings: No rash.  Neurological:     General: No focal deficit present.     Mental Status: He is alert and oriented to person, place, and time.  Psychiatric:        Mood and Affect: Mood normal.        Behavior: Behavior normal.        Thought Content: Thought content normal.        Judgment: Judgment normal.       Results for orders placed or performed in visit on 03/20/21  Sedimentation rate  Result Value Ref Range   Sed Rate 3 0 - 20 mm/hr  VITAMIN D 25 Hydroxy (Vit-D Deficiency, Fractures)  Result Value Ref Range   VITD 23.83 (L) 30.00 - 100.00 ng/mL  CBC with Differential/Platelet  Result Value Ref Range   WBC 4.6 4.0 - 10.5 K/uL   RBC 4.80 4.22 - 5.81  Mil/uL   Hemoglobin 15.6 13.0 - 17.0 g/dL   HCT 45.4 39.0 - 52.0 %   MCV 94.6 78.0 - 100.0 fl   MCHC 34.3 30.0 - 36.0 g/dL   RDW 13.4 11.5 - 15.5 %   Platelets 251.0 150.0 - 400.0 K/uL   Neutrophils Relative % 55.8 43.0 - 77.0 %   Lymphocytes Relative 31.5 12.0 - 46.0 %   Monocytes Relative 9.9 3.0 - 12.0 %   Eosinophils Relative 2.1 0.0 - 5.0 %   Basophils Relative 0.7 0.0 - 3.0 %   Neutro Abs 2.6 1.4 - 7.7 K/uL   Lymphs Abs 1.5 0.7 - 4.0 K/uL   Monocytes Absolute 0.5 0.1 - 1.0 K/uL   Eosinophils Absolute 0.1 0.0 - 0.7 K/uL   Basophils Absolute 0.0 0.0 - 0.1 K/uL  Comprehensive metabolic panel  Result Value Ref Range   Sodium 139 135 - 145 mEq/L   Potassium 4.2 3.5 - 5.1 mEq/L   Chloride 105 96 - 112 mEq/L   CO2 28 19 - 32 mEq/L   Glucose, Bld 100 (H) 70 - 99 mg/dL   BUN 13 6 - 23 mg/dL   Creatinine, Ser 1.01 0.40 - 1.50 mg/dL   Total Bilirubin 1.0 0.2 - 1.2 mg/dL   Alkaline Phosphatase 51 39 - 117 U/L   AST 22 0 - 37 U/L   ALT 38 0 - 53 U/L   Total Protein 6.7 6.0 - 8.3 g/dL   Albumin 4.3 3.5 - 5.2 g/dL   GFR 81.73 >60.00 mL/min   Calcium 9.1 8.4 - 10.5 mg/dL    Assessment & Plan:  This visit occurred during the SARS-CoV-2 public health emergency.  Safety protocols were in place, including screening questions prior to the visit, additional usage of staff PPE, and extensive cleaning of exam room while observing appropriate contact time as indicated for disinfecting solutions.   Problem List Items Addressed This Visit     Health maintenance examination - Primary (Chronic)    Preventative protocols reviewed and updated  unless pt declined. Discussed healthy diet and lifestyle.       Hyperlipidemia, unspecified    Chronic, stable on MWF lipitor - update FLP The 10-year ASCVD risk score (Arnett DK, et al., 2019) is: 5.8%   Values used to calculate the score:     Age: 70 years     Sex: Male     Is Non-Hispanic African American: No     Diabetic: No     Tobacco smoker: No     Systolic Blood Pressure: 606 mmHg     Is BP treated: No     HDL Cholesterol: 44 mg/dL     Total Cholesterol: 147 mg/dL       Relevant Medications   atorvastatin (LIPITOR) 20 MG tablet   Ulcerative colitis ([place])    Stable period - regularly sees GI pending rpt colonoscopy.       Relevant Orders   Sedimentation rate   Hyperglycemia    Update labs.       Arthritis associated with inflammatory bowel disease    Continues celebrex 100mg  bid.  Has seen rheum.       Relevant Medications   celecoxib (CELEBREX) 100 MG capsule   Immunosuppression ([place])    longterm Imuran use - CBC Q43mo Discussed pneumococcal vaccine - consider next year.       Vitamin D deficiency    Update levels off replacement.       Relevant Orders  VITAMIN D 25 Hydroxy (Vit-D Deficiency, Fractures)   Other Visit Diagnoses     Special screening for malignant neoplasm of prostate            Meds ordered this encounter  Medications   celecoxib (CELEBREX) 100 MG capsule    Sig: Take 1 capsule (100 mg total) by mouth 2 (two) times daily.    Dispense:  180 capsule    Refill:  3   azaTHIOprine (IMURAN) 50 MG tablet    Sig: Take 2 tablets (100 mg total) by mouth daily.    Dispense:  180 tablet    Refill:  3   atorvastatin (LIPITOR) 20 MG tablet    Sig: Take 1 tablet (20 mg total) by mouth every Monday, Wednesday, and Friday.    Dispense:  36 tablet    Refill:  3   mesalamine (APRISO) 0.375 g 24 hr capsule    Sig: Take 4 capsules (1.5 g total) by mouth daily.    Dispense:  360 capsule    Refill:  3   Orders Placed This Encounter   Procedures   VITAMIN D 25 Hydroxy (Vit-D Deficiency, Fractures)   Sedimentation rate     Patient instructions: Labs today  Medicines refilled today.  Return as need or in 1 year for next physical.   Follow up plan: Return in about 1 year (around 09/21/2022) for annual exam, prior fasting for blood work.  Ria Bush, MD

## 2021-09-21 NOTE — Assessment & Plan Note (Signed)
Update labs.  

## 2021-09-21 NOTE — Assessment & Plan Note (Signed)
Preventative protocols reviewed and updated unless pt declined. Discussed healthy diet and lifestyle.  

## 2021-09-21 NOTE — Assessment & Plan Note (Signed)
Update levels off replacement. 

## 2021-09-21 NOTE — Assessment & Plan Note (Signed)
Continues celebrex 100mg  bid.  Has seen rheum.

## 2021-09-21 NOTE — Assessment & Plan Note (Addendum)
longterm Imuran use - CBC Q38mo Discussed pneumococcal vaccine - consider next year.

## 2021-09-23 ENCOUNTER — Emergency Department: Payer: BC Managed Care – PPO

## 2021-09-23 ENCOUNTER — Other Ambulatory Visit: Payer: Self-pay

## 2021-09-23 ENCOUNTER — Emergency Department
Admission: EM | Admit: 2021-09-23 | Discharge: 2021-09-23 | Disposition: A | Discharge status: Home / Self Care | Payer: BC Managed Care – PPO | Attending: Emergency Medicine | Admitting: Emergency Medicine

## 2021-09-23 ENCOUNTER — Encounter: Payer: Self-pay | Admitting: Emergency Medicine

## 2021-09-23 DIAGNOSIS — W260XXA Contact with knife, initial encounter: Secondary | ICD-10-CM | POA: Diagnosis not present

## 2021-09-23 DIAGNOSIS — J45909 Unspecified asthma, uncomplicated: Secondary | ICD-10-CM | POA: Insufficient documentation

## 2021-09-23 DIAGNOSIS — S61210A Laceration without foreign body of right index finger without damage to nail, initial encounter: Secondary | ICD-10-CM | POA: Insufficient documentation

## 2021-09-23 MED ORDER — BACITRACIN-NEOMYCIN-POLYMYXIN 400-5-5000 EX OINT
TOPICAL_OINTMENT | Freq: Once | CUTANEOUS | Status: AC
  Filled 2021-09-23: qty 1

## 2021-09-23 MED ORDER — LIDOCAINE HCL (PF) 1 % IJ SOLN
5.0000 mL | Freq: Once | INTRAMUSCULAR | Status: AC
  Administered 2021-09-23: 14:00:00 5 mL via INTRADERMAL
  Filled 2021-09-23: qty 5

## 2021-09-23 NOTE — ED Provider Notes (Signed)
HPI: Pt is a 60 y.o. male who presents with complaints of finger lac   The patient p/w  lac to finger   ROS: Denies fever, chest pain, vomiting  Past Medical History:  Diagnosis Date   Exercise-induced asthma    Herpes zoster 10/22/2018   History of chicken pox    History of kidney stones 1996   Seasonal allergic rhinitis    Ulcerative colitis ([place])    There were no vitals filed for this visit.  Focused Physical Exam: Gen: No acute distress Head: atraumatic, normocephalic Eyes: Extraocular movements grossly intact; conjunctiva clear CV: RRR Lung: No increased WOB, no stridor GI: ND, no obvious masses Neuro: Alert and awake Lac to tip of finger 2nd R   Medical Decision Making and Plan: Given the patient's initial medical screening exam, the following diagnostic evaluation has been ordered. The patient will be placed in the appropriate treatment space, once one is available, to complete the evaluation and treatment. I have discussed the plan of care with the patient and I have advised the patient that an ED physician or mid-level practitioner will reevaluate their condition after the test results have been received, as the results may give them additional insight into the type of treatment they may need.   Diagnostics: xray   Treatments: none immediately   Vanessa Lyons, MD 09/23/21 [phone]

## 2021-09-23 NOTE — Discharge Instructions (Signed)
Do not get the sutured area wet for 24 hours. After 24 hours, shower/bathe as usual and pat the area dry. °Change the bandage 2 times per day and apply antibiotic ointment. °Leave open to air when at no risk of getting the area dirty, but cover at night before bed. °See your PCP or go to Urgent Care in 10 days for suture removal or sooner for signs or concern of infection. ° °

## 2021-09-23 NOTE — ED Notes (Signed)
Dc ppw provided to patient. Followup information provided. RX information given. Questions answered. Pt provides verbal consent for discharge.Pt given finger splint and applied with instructions for use. VS declined at dc. Pt assisted off unit.

## 2021-09-23 NOTE — ED Triage Notes (Signed)
Pt reports cut right hand index finger with a knife today. Bleeding controlled

## 2021-09-25 ENCOUNTER — Other Ambulatory Visit: Payer: Self-pay | Admitting: Family Medicine

## 2021-09-25 MED ORDER — VITAMIN D3 25 MCG (1000 UT) PO CAPS: 1.0000 | ORAL_CAPSULE | Freq: Every day | ORAL | Status: AC

## 2021-09-27 ENCOUNTER — Encounter: Payer: Self-pay | Admitting: Family Medicine

## 2021-09-27 DIAGNOSIS — S61219A Laceration without foreign body of unspecified finger without damage to nail, initial encounter: Secondary | ICD-10-CM

## 2021-09-27 DIAGNOSIS — S61200A Unspecified open wound of right index finger without damage to nail, initial encounter: Secondary | ICD-10-CM

## 2021-10-01 ENCOUNTER — Other Ambulatory Visit: Payer: Self-pay

## 2021-10-01 ENCOUNTER — Encounter: Payer: Self-pay | Admitting: Family Medicine

## 2021-10-01 ENCOUNTER — Ambulatory Visit: Payer: BC Managed Care – PPO | Admitting: Family Medicine

## 2021-10-01 DIAGNOSIS — S61219A Laceration without foreign body of unspecified finger without damage to nail, initial encounter: Secondary | ICD-10-CM | POA: Insufficient documentation

## 2021-10-01 NOTE — Progress Notes (Signed)
Patient ID: Dustin Salazar, male    DOB: 05-Aug-1961, 60 y.o.   MRN: [id number]  This visit was conducted in person.  BP 140/82   Pulse 80   Temp 97.7 F (36.5 C) (Temporal)   Ht 5\' 8"  (1.727 m)   Wt 174 lb 7 oz (79.1 kg)   SpO2 97%   BMI 26.52 kg/m    CC: wound check Subjective:   HPI: Dustin Salazar is a 60 y.o. male presenting on 10/01/2021 for Wound Check (Here to chk laceration to right index finger. )   DOI: 09/23/2021 Sustained R index finger laceration through nail bed while working with utility blade (had just replaced switch blade).  Seen at ER s/p 2 absorbable nail sutures and 3 skin sutures - rec removal after day 10. Given concern over poor approximation, he removed absorbable sutures and instead used steri strips to close wound.  Xray showed no fracture.  Continues celebrex for pain.   Last Tdap 01/2015.  He is on long-term imuran.      Relevant past medical, surgical, family and social history reviewed and updated as indicated. Interim medical history since our last visit reviewed. Allergies and medications reviewed and updated. Outpatient Medications Prior to Visit  Medication Sig Dispense Refill   atorvastatin (LIPITOR) 20 MG tablet Take 1 tablet (20 mg total) by mouth every Monday, Wednesday, and Friday. 36 tablet 3   azaTHIOprine (IMURAN) 50 MG tablet Take 2 tablets (100 mg total) by mouth daily. 180 tablet 3   celecoxib (CELEBREX) 100 MG capsule Take 1 capsule (100 mg total) by mouth 2 (two) times daily. 180 capsule 3   cetirizine (ZYRTEC) 10 MG tablet Take 10 mg by mouth daily.     Cholecalciferol (VITAMIN D3) 25 MCG (1000 UT) CAPS Take 1 capsule (1,000 Units total) by mouth daily. 30 capsule    mesalamine (APRISO) 0.375 g 24 hr capsule Take 4 capsules (1.5 g total) by mouth daily. 360 capsule 3   No facility-administered medications prior to visit.     Per HPI unless specifically indicated in ROS section below Review of  Systems  Objective:  BP 140/82   Pulse 80   Temp 97.7 F (36.5 C) (Temporal)   Ht 5\' 8"  (1.727 m)   Wt 174 lb 7 oz (79.1 kg)   SpO2 97%   BMI 26.52 kg/m   Wt Readings from Last 3 Encounters:  10/01/21 174 lb 7 oz (79.1 kg)  09/23/21 171 lb 15.3 oz (78 kg)  09/21/21 170 lb 9 oz (77.4 kg)      Physical Exam Vitals and nursing note reviewed.  Constitutional:      Appearance: Normal appearance. He is not ill-appearing.  Musculoskeletal:        General: Normal range of motion.  Skin:    General: Skin is warm and dry.     Findings: Bruising and laceration present. No erythema.     Comments: R healing laceration through R 2nd index finger tip, nail not involved, darker area radially but wound edges overall well approximated  Neurological:     Mental Status: He is alert.          Assessment & Plan:  This visit occurred during the SARS-CoV-2 public health emergency.  Safety protocols were in place, including screening questions prior to the visit, additional usage of staff PPE, and extensive cleaning of exam room while observing appropriate contact time as indicated for disinfecting solutions.   Problem List  Items Addressed This Visit     Finger laceration, initial encounter    Injury to R index finger s/p sutures at ER. They will remove 3 skin sutures at home later this week.  No signs of infection.  Darker area will monitor, don't recommend debridement at this time.  He will keep me updated via mychart.         No orders of the defined types were placed in this encounter.  No orders of the defined types were placed in this encounter.    Follow up plan: No follow-ups on file.  Ria Bush, MD

## 2021-10-01 NOTE — Assessment & Plan Note (Addendum)
Injury to R index finger s/p sutures at ER. They will remove 3 skin sutures at home later this week.  No signs of infection.  Darker area will monitor, don't recommend debridement at this time.  He will keep me updated via mychart.

## 2021-10-08 DIAGNOSIS — K5100 Ulcerative (chronic) pancolitis without complications: Secondary | ICD-10-CM | POA: Diagnosis not present

## 2021-10-08 DIAGNOSIS — D123 Benign neoplasm of transverse colon: Secondary | ICD-10-CM | POA: Diagnosis not present

## 2021-10-08 DIAGNOSIS — Z1211 Encounter for screening for malignant neoplasm of colon: Secondary | ICD-10-CM | POA: Diagnosis not present

## 2021-10-08 DIAGNOSIS — K640 First degree hemorrhoids: Secondary | ICD-10-CM | POA: Diagnosis not present

## 2021-10-08 DIAGNOSIS — K5190 Ulcerative colitis, unspecified, without complications: Secondary | ICD-10-CM | POA: Diagnosis not present

## 2021-10-08 DIAGNOSIS — Z86010 Personal history of colonic polyps: Secondary | ICD-10-CM | POA: Diagnosis not present

## 2021-10-08 DIAGNOSIS — K635 Polyp of colon: Secondary | ICD-10-CM | POA: Diagnosis not present

## 2021-10-08 MED ORDER — CEPHALEXIN 500 MG PO CAPS: 500.0000 mg | ORAL_CAPSULE | Freq: Four times a day (QID) | ORAL | 0 refills | Status: DC

## 2021-10-08 NOTE — Addendum Note (Signed)
Addended by: Ria Bush on: 10/08/2021 11:01 AM   Modules accepted: Orders

## 2021-10-16 NOTE — Addendum Note (Signed)
Addended by: Ria Bush on: 10/16/2021 02:01 PM   Modules accepted: Orders

## 2021-10-19 ENCOUNTER — Encounter: Payer: Self-pay | Admitting: Family Medicine

## 2021-10-24 NOTE — Addendum Note (Signed)
Addended by: Ria Bush on: 10/24/2021 08:07 AM   Modules accepted: Orders

## 2021-10-29 MED ORDER — CEPHALEXIN 500 MG PO CAPS: 500.0000 mg | ORAL_CAPSULE | Freq: Four times a day (QID) | ORAL | 0 refills | Status: DC

## 2021-10-29 NOTE — Addendum Note (Signed)
Addended by: Ria Bush on: 10/29/2021 04:18 PM   Modules accepted: Orders

## 2022-01-28 ENCOUNTER — Encounter: Payer: Self-pay | Admitting: Family Medicine

## 2022-01-28 DIAGNOSIS — M533 Sacrococcygeal disorders, not elsewhere classified: Secondary | ICD-10-CM

## 2022-01-30 ENCOUNTER — Ambulatory Visit (INDEPENDENT_AMBULATORY_CARE_PROVIDER_SITE_OTHER)
Admission: RE | Admit: 2022-01-30 | Discharge: 2022-01-30 | Disposition: A | Discharge status: Home / Self Care | Payer: BC Managed Care – PPO | Source: Ambulatory Visit | Attending: Family Medicine | Admitting: Family Medicine

## 2022-01-30 DIAGNOSIS — M533 Sacrococcygeal disorders, not elsewhere classified: Secondary | ICD-10-CM | POA: Insufficient documentation

## 2022-01-30 DIAGNOSIS — M5416 Radiculopathy, lumbar region: Secondary | ICD-10-CM | POA: Insufficient documentation

## 2022-02-06 ENCOUNTER — Ambulatory Visit: Payer: BC Managed Care – PPO | Admitting: Family Medicine

## 2022-04-24 ENCOUNTER — Ambulatory Visit: Payer: BC Managed Care – PPO | Admitting: Family Medicine

## 2022-04-24 ENCOUNTER — Ambulatory Visit (INDEPENDENT_AMBULATORY_CARE_PROVIDER_SITE_OTHER)
Admission: RE | Admit: 2022-04-24 | Discharge: 2022-04-24 | Disposition: A | Discharge status: Home / Self Care | Payer: BC Managed Care – PPO | Source: Ambulatory Visit | Attending: Family Medicine | Admitting: Family Medicine

## 2022-04-24 ENCOUNTER — Encounter: Payer: Self-pay | Admitting: Family Medicine

## 2022-04-24 VITALS — BP 122/84 | HR 78 | Temp 97.4°F | Ht 68.0 in | Wt 175.0 lb

## 2022-04-24 DIAGNOSIS — M5416 Radiculopathy, lumbar region: Secondary | ICD-10-CM

## 2022-04-24 DIAGNOSIS — M5136 Other intervertebral disc degeneration, lumbar region: Secondary | ICD-10-CM | POA: Diagnosis not present

## 2022-04-24 MED ORDER — PREDNISONE 20 MG PO TABS: ORAL_TABLET | ORAL | 0 refills | Status: AC

## 2022-04-24 NOTE — Patient Instructions (Addendum)
Lumbar xrays today Prednisone course and we will refer you to formal physical therapy.  Keep [place] updated. If worsening, we will proceed with lumbar MRI.

## 2022-04-24 NOTE — Progress Notes (Signed)
Patient ID: Dustin Salazar, male    DOB: May 09, 1961, 61 y.o.   MRN: [id number]  This visit was conducted in person.  BP 122/84   Pulse 78   Temp (!) 97.4 F (36.3 C) (Temporal)   Ht '5\' 8"'$  (1.727 m)   Wt 175 lb (79.4 kg)   SpO2 96%   BMI 26.61 kg/m    CC: R sciatica pain Subjective:   HPI: Dustin Salazar is a 61 y.o. male presenting on 04/24/2022 for Back Pain (C/o R side sciatica pain worsening. )   DOI: mid April 2023 After working in the yard pruning tree, had some R SI joint discomfort for 1+ month. After cycle ride 01/2022, started having sharp R buttock pain with radiation down leg (severe pain), worse with walking uphill/incline. Also feels worse when he wakes up in the morning. Associated with numbness/tingling to R foot. Over the past month he's been doing home exercises - lumbar extension. No significant lower back pain throughout all of this.   Tried 1 wk off statin without benefit.  Recently returned from [place].  Has continued celebrex '100mg'$  BID as well as diclofenac topically.   Over the past 2 weeks notes significant improvement.  Still with persistent R buttock ache and numbness and paresthesias to R foot - first 3 toes.   No fevers/chills, saddle anesthesia, bowel/bladder incontinence.   Mother with L3/4 stenosis and L4/5 disc herniation surgery in her 30s.  Sister with lumbar stenosis s/p surgery.   Ulcerative colitis managed with azathioprine 100 mg daily and mesalamine 4 capsules daily.  Sees [facility] Dr Julien Nordmann every 2 years.  Saw rheum 03/2020 for positive RF in setting of polyarthralgias, predominantly hands - told not RA. Managing polyarthralgias with celebrex '100mg'$  BID daily with benefit.      Relevant past medical, surgical, family and social history reviewed and updated as indicated. Interim medical history since our last visit reviewed. Allergies and medications reviewed and updated. Outpatient Medications Prior to Visit  Medication  Sig Dispense Refill   atorvastatin (LIPITOR) 20 MG tablet Take 1 tablet (20 mg total) by mouth every Monday, Wednesday, and Friday. 36 tablet 3   azaTHIOprine (IMURAN) 50 MG tablet Take 2 tablets (100 mg total) by mouth daily. 180 tablet 3   celecoxib (CELEBREX) 100 MG capsule Take 1 capsule (100 mg total) by mouth 2 (two) times daily. 180 capsule 3   cetirizine (ZYRTEC) 10 MG tablet Take 10 mg by mouth daily.     Cholecalciferol (VITAMIN D3) 25 MCG (1000 UT) CAPS Take 1 capsule (1,000 Units total) by mouth daily. 30 capsule    mesalamine (APRISO) 0.375 g 24 hr capsule Take 4 capsules (1.5 g total) by mouth daily. 360 capsule 3   cephALEXin (KEFLEX) 500 MG capsule Take 1 capsule (500 mg total) by mouth 4 (four) times daily. 28 capsule 0   No facility-administered medications prior to visit.     Per HPI unless specifically indicated in ROS section below Review of Systems  Objective:  BP 122/84   Pulse 78   Temp (!) 97.4 F (36.3 C) (Temporal)   Ht '5\' 8"'$  (1.727 m)   Wt 175 lb (79.4 kg)   SpO2 96%   BMI 26.61 kg/m   Wt Readings from Last 3 Encounters:  04/24/22 175 lb (79.4 kg)  10/01/21 174 lb 7 oz (79.1 kg)  09/23/21 171 lb 15.3 oz (78 kg)      Physical Exam Vitals and nursing  note reviewed.  Constitutional:      Appearance: Normal appearance. He is not ill-appearing.  Musculoskeletal:     Right lower leg: No edema.     Left lower leg: No edema.     Comments:  No pain midline spine No paraspinous mm tenderness + SLR on right. No pain with int/ext rotation at hip. Neg FABER. No pain at SIJ, GTB or sciatic notch bilaterally.   Skin:    General: Skin is warm and dry.     Findings: No rash.  Neurological:     Mental Status: He is alert.     Motor: Motor function is intact.     Coordination: Coordination is intact.     Deep Tendon Reflexes:     Reflex Scores:      Patellar reflexes are 1+ on the right side and 2+ on the left side.      Achilles reflexes are 1+ on the  right side and 1+ on the left side.    Comments:  Diminished patellar DTR on right 5/5 strength BLE Sensation intact to light touch and temperature bilateral legs  Psychiatric:        Mood and Affect: Mood normal.        Behavior: Behavior normal.       DG Si Joints CLINICAL DATA:  Right sacroiliac joint pain x5 weeks.  EXAM: BILATERAL SACROILIAC JOINTS - 3+ VIEW  COMPARISON:  None.  FINDINGS: The sacroiliac joint spaces are maintained and there is no evidence of arthropathy. No other bone abnormalities are seen.  IMPRESSION: Negative.  Electronically Signed   By: Virgina Norfolk M.D.   On: 01/30/2022 20:45   Assessment & Plan:   Problem List Items Addressed This Visit     Chronic right-sided lumbar radiculopathy - Primary    Ongoing symptoms for >2 months, describes R lumbar radiculitis of L4/5 with positive SLR on right and diminished patellar reflex.  So far has tried celebrex, topical voltaren, and home lumbar extension exercise regimen for radiculopathy. Ongoing symptoms.  Check lumbar films today, Rx prednisone taper, refer to formal physical therapy.  If not improved with above or any worsening, discussed lumbar MRI as next step. Pt agrees with plan.       Relevant Orders   DG Lumbar Spine Complete   Ambulatory referral to Physical Therapy     Meds ordered this encounter  Medications   predniSONE (DELTASONE) 20 MG tablet    Sig: Take two tablets daily for 4 days followed by one tablet daily for 4 days    Dispense:  12 tablet    Refill:  0   Orders Placed This Encounter  Procedures   DG Lumbar Spine Complete    Standing Status:   Future    Standing Expiration Date:   04/25/2023    Order Specific Question:   Reason for Exam (SYMPTOM  OR DIAGNOSIS REQUIRED)    Answer:   R lumbar radiculopathy    Order Specific Question:   Preferred imaging location?    Answer:   [facility]   Ambulatory referral to Physical Therapy    Referral Priority:    Routine    Referral Type:   Physical Medicine    Referral Reason:   Specialty Services Required    Requested Specialty:   Physical Therapy    Number of Visits Requested:   1     Patient Instructions  Lumbar xrays today Prednisone course and we will refer you to  formal physical therapy.  Keep [place] updated. If worsening, we will proceed with lumbar MRI.   Follow up plan: No follow-ups on file.  Ria Bush, MD

## 2022-04-24 NOTE — Assessment & Plan Note (Signed)
Ongoing symptoms for >2 months, describes R lumbar radiculitis of L4/5 with positive SLR on right and diminished patellar reflex.  So far has tried celebrex, topical voltaren, and home lumbar extension exercise regimen for radiculopathy. Ongoing symptoms.  Check lumbar films today, Rx prednisone taper, refer to formal physical therapy.  If not improved with above or any worsening, discussed lumbar MRI as next step. Pt agrees with plan.

## 2022-04-25 ENCOUNTER — Encounter: Payer: Self-pay | Admitting: Family Medicine

## 2022-04-25 DIAGNOSIS — E559 Vitamin D deficiency, unspecified: Secondary | ICD-10-CM

## 2022-04-25 DIAGNOSIS — D849 Immunodeficiency, unspecified: Secondary | ICD-10-CM

## 2022-04-25 DIAGNOSIS — K5190 Ulcerative colitis, unspecified, without complications: Secondary | ICD-10-CM

## 2022-05-28 ENCOUNTER — Ambulatory Visit: Payer: BC Managed Care – PPO | Attending: Family Medicine | Admitting: Physical Therapy

## 2022-05-28 DIAGNOSIS — M5416 Radiculopathy, lumbar region: Secondary | ICD-10-CM | POA: Diagnosis not present

## 2022-05-28 DIAGNOSIS — M5459 Other low back pain: Secondary | ICD-10-CM | POA: Insufficient documentation

## 2022-05-28 NOTE — Therapy (Signed)
OUTPATIENT PHYSICAL THERAPY THORACOLUMBAR EVALUATION   Patient Name: Dustin Salazar MRN: [id number] DOB:September 24, 1961, 61 y.o., male Today's Date: 05/28/2022    Past Medical History:  Diagnosis Date   Exercise-induced asthma    Herpes zoster 10/22/2018   History of chicken pox    History of kidney stones 1996   Seasonal allergic rhinitis    Ulcerative colitis [facility])    Past Surgical History:  Procedure Laterality Date   COLONOSCOPY  01/2018   SSA, rpt 2 yrs (Dr Julien Nordmann in [place])   [place]  06/2020   no active colitis or dysplasia, fair prep - rpt colonoscopy 1 yr with more extensive prep ([facility] with [place])   COLONOSCOPY  09/2021   WNL, rpt 2 yrs (Furs @ [place])   [place] Bilateral 2010   [place]     Patient Active Problem List   Diagnosis Date Noted   Chronic right-sided lumbar radiculopathy 01/30/2022   Finger laceration, initial encounter 10/01/2021   Vitamin D deficiency 09/21/2021   Immunosuppression ([place]) 03/02/2021   Arthritis associated with inflammatory bowel disease 09/15/2019   Skin rash 10/22/2018   Hyperglycemia 08/14/2018   Health maintenance examination 08/12/2017   Exercise-induced asthma    Seasonal allergic rhinitis    Ulcerative colitis ([place])    Hyperlipidemia, unspecified 12/26/2013    PCP: Ria Bush  REFERRING PROVIDER: Ria Bush  REFERRING DIAG: R sided LBP   Rationale for Evaluation and Treatment Rehabilitation  THERAPY DIAG:  No diagnosis found.  ONSET DATE: Feb 2023  SUBJECTIVE:                                                                                                                                                                                           SUBJECTIVE STATEMENT: LBP with R radiculopathy  PERTINENT HISTORY:  Pt is a 61 year old male presenting with LBP with R radicular pain since Feb 2023. Reports he  started having pain after tree pruning, golfing, and cycling. Feels like he flared up his pain over these activities. He has baseline of LBP with worsening radicular pain in the am in the first half an hour-hour after waking all the way to the foot. Reports he has "bussing" sensation in the R buttock at baseline, but that it will travel down the leg with forward bending and rotational movements to the L. Pt works part time doing desk work, that his pain does not bother. Pt has not golfed since injury due to pain with rotation of swing. He has tried cycling, is able to  complete on flat in [place], but stood to cycle up hill and that brought on his pain. Current and best pain 0.5/19; worst 3/10. Pain is better with extension. Pt denies N/V, B&B changes, unexplained weight fluctuation, saddle paresthesia, fever, night sweats, or unrelenting night pain at this time.   PAIN:  Are you having pain? Yes: NPRS scale: 0.5/10 Pain location: Buttock, and RLE  Pain description: buzzing Aggravating factors: forward bending, am, L rotation Relieving factors: cobra, lumbar/thoracic ext   PRECAUTIONS: None  WEIGHT BEARING RESTRICTIONS No  FALLS:  Has patient fallen in last 6 months? 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  PATIENT SURVEYS:  {rehab surveys:24030}  SCREENING FOR RED FLAGS: Bowel or bladder incontinence: No Spinal tumors: No Cauda equina syndrome: {Yes/No:304960894} Compression fracture: {Yes/No:304960894} Abdominal aneurysm: No  COGNITION:  Overall cognitive status: Within functional limits for tasks assessed     SENSATION: WFL  MUSCLE LENGTH: Hamstrings: WNL bilat Thomas test: WNL  bilat  POSTURE: No Significant postural limitations  PALPATION: TTP at superior glute max/med with palpable tension. Palpable tension to bilat lumbar paraspinals concordant pain to R side  LUMBAR ROM:   All lumbar and BLE ROM WNL with classic centralization to repeated ext; peripheralization with flexion     LOWER EXTREMITY MMT:    5/5 for all lower extremity MMT  Bering Sorenson 52sec  Plank 60sec and ceased  LUMBAR SPECIAL TESTS:  Straight leg raise test: Positive, Slump test: Positive, Single leg stance test: Negative, FABER test: Negative, and Thomas test: Negative  FUNCTIONAL TESTS:  Squat assessment next visit  GAIT: Distance walked: 7f Assistive device utilized:  none Level of assistance: Complete Independence Comments: Fairly normalized gait with normal gait speed    TODAY'S TREATMENT  ***   PATIENT EDUCATION:  Education details: Patient was educated on diagnosis, anatomy and pathology involved, prognosis, role of PT, and was given an HEP, demonstrating exercise with proper form following verbal and tactile cues, and was given a paper hand out to continue exercise at home. Pt was educated on and agreed to plan of care.  Person educated: Patient Education method: Explanation, Verbal cues, and Handouts Education comprehension: verbalized understanding and returned demonstration   HOME EXERCISE PROGRAM: To complete in am prior to getting out of bed: Lower trunk rotations x20 SKTC x3e 5-10sec Prone press x12-20 Sciatic nerve glide PRN  ASSESSMENT:  CLINICAL IMPRESSION: Patient is a *** y.o. *** who was seen today for physical therapy evaluation and treatment for ***.    OBJECTIVE IMPAIRMENTS {opptimpairments:25111}.   ACTIVITY LIMITATIONS {activitylimitations:27494}  PARTICIPATION LIMITATIONS: {participationrestrictions:25113}  PERSONAL FACTORS {Personal factors:25162} are also affecting patient's functional outcome.   REHAB POTENTIAL:  {rehabpotential:25112}  CLINICAL DECISION MAKING: {clinical decision making:25114}  EVALUATION COMPLEXITY: {Evaluation complexity:25115}   GOALS: Goals reviewed with patient? {yes/no:20286}  SHORT TERM GOALS: Target date: {follow up:25551}  *** Baseline: Goal status: {GOALSTATUS:25110}  2.  *** Baseline:  Goal status: {GOALSTATUS:25110}  3.  *** Baseline:  Goal status: {GOALSTATUS:25110}  4.  *** Baseline:  Goal status: {GOALSTATUS:25110}  5.  *** Baseline:  Goal status: {GOALSTATUS:25110}  6.  *** Baseline:  Goal status: {GOALSTATUS:25110}  LONG TERM GOALS: Target date: {follow up:25551}  *** Baseline:  Goal  status: {GOALSTATUS:25110}  2.  *** Baseline:  Goal status: {GOALSTATUS:25110}  3.  *** Baseline:  Goal status: {GOALSTATUS:25110}  4.  *** Baseline:  Goal status: {GOALSTATUS:25110}  5.  *** Baseline:  Goal status: {GOALSTATUS:25110}  6.  *** Baseline:  Goal status: {GOALSTATUS:25110}   PLAN: PT FREQUENCY: {rehab frequency:25116}  PT DURATION: {rehab duration:25117}  PLANNED INTERVENTIONS: {rehab planned interventions:25118::"Therapeutic exercises","Therapeutic activity","Neuromuscular re-education","Balance training","Gait training","Patient/Family education","Self Care","Joint mobilization"}.  PLAN FOR NEXT SESSION: ***   Durwin Reges, PT 05/28/2022, 11:33 AM

## 2022-05-31 ENCOUNTER — Encounter: Payer: Self-pay | Admitting: Physical Therapy

## 2022-06-01 NOTE — Addendum Note (Signed)
Addended by: Ria Bush on: 06/01/2022 12:24 PM   Modules accepted: Orders

## 2022-06-04 ENCOUNTER — Ambulatory Visit: Payer: BC Managed Care – PPO | Admitting: Physical Therapy

## 2022-06-04 ENCOUNTER — Encounter: Payer: Self-pay | Admitting: Physical Therapy

## 2022-06-04 DIAGNOSIS — M5416 Radiculopathy, lumbar region: Secondary | ICD-10-CM | POA: Diagnosis not present

## 2022-06-04 DIAGNOSIS — M5459 Other low back pain: Secondary | ICD-10-CM

## 2022-06-04 NOTE — Therapy (Signed)
OUTPATIENT PHYSICAL THERAPY TREATMENT NOTE   Patient Name: Dustin Salazar MRN: [id number] DOB:Jun 08, 1961, 61 y.o., male Today's Date: 06/04/2022  PCP:  Ria Bush MD REFERRING PROVIDER:  Ria Bush MD  END OF SESSION:   PT End of Session - 06/04/22 1150     Visit Number 2    Number of Visits 17    Date for PT Re-Evaluation 07/19/22    Authorization - Visit Number 2    Authorization - Number of Visits 13    Progress Note Due on Visit 10    PT Start Time 1130    PT Stop Time 1209    PT Time Calculation (min) 39 min    Activity Tolerance Patient tolerated treatment well    Behavior During Therapy [facility] for tasks assessed/performed             Past Medical History:  Diagnosis Date   Exercise-induced asthma    Herpes zoster 10/22/2018   History of chicken pox    History of kidney stones 1996   Seasonal allergic rhinitis    Ulcerative colitis ([place])    Past Surgical History:  Procedure Laterality Date   COLONOSCOPY  01/2018   SSA, rpt 2 yrs (Dr Julien Nordmann in [place])   [place]  06/2020   no active colitis or dysplasia, fair prep - rpt colonoscopy 1 yr with more extensive prep ([facility] with [place])   COLONOSCOPY  09/2021   WNL, rpt 2 yrs (Furs @ [place])   HERNIA REPAIR Bilateral 2010   [place]     Patient Active Problem List   Diagnosis Date Noted   Chronic right-sided lumbar radiculopathy 01/30/2022   Finger laceration, initial encounter 10/01/2021   Vitamin D deficiency 09/21/2021   Immunosuppression ([place]) 03/02/2021   Arthritis associated with inflammatory bowel disease 09/15/2019   Skin rash 10/22/2018   Hyperglycemia 08/14/2018   Health maintenance examination 08/12/2017   Exercise-induced asthma    Seasonal allergic rhinitis    Ulcerative colitis ([place])    Hyperlipidemia, unspecified 12/26/2013    REFERRING DIAG: LBP  THERAPY DIAG:  Other low back  pain  Chronic right-sided lumbar radiculopathy  Rationale for Evaluation and Treatment Rehabilitation  PERTINENT HISTORY: Pt is a 61 year old male presenting with LBP with R radicular pain since Feb 2023. Reports he started having pain after tree pruning, golfing, and cycling. Feels like pain flared up over these activities. He has baseline of LBP with worsening radicular pain in the am in the first half an hour-hour after waking all the way to the foot. Reports he has "buzzing" sensation in the R buttock at baseline, but that it will travel down the leg with forward bending and rotational movements to the L. 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 Pt has not golfed since injury due to pain with rotation of swing. He has tried cycling, is able to complete on flat in [place], but stood to cycle up hill and that brought on his pain. Current and best pain 0.5/19; worst 3/10. Pain is better with extension. Pt denies N/V, B&B changes, unexplained weight fluctuation, saddle paresthesia, fever, night sweats, or unrelenting night pain at this time.    PRECAUTIONS: none  SUBJECTIVE: Pt reports doing well overall, no pain or "buzzing" currently. Reports am radicular sensations are improving in am with HEP.   PAIN:  Are you having pain? Yes:  NPRS scale: 0/10 Pain location: R sided LB with RLE radiculopathy  Pain description: electrical, "buzzing"   OBJECTIVE: (objective measures completed at initial evaluation unless otherwise dated)  OBJECTIVE:    DIAGNOSTIC FINDINGS:  04/25/22 Mild to moderate severity multilevel degenerative changes in the lumbar spine.   PATIENT SURVEYS:  FOTO 59 with goal of 72   SCREENING FOR RED FLAGS: Bowel or bladder incontinence: No Spinal tumors: No Cauda equina syndrome: No Compression fracture: No Abdominal aneurysm: No   COGNITION:           Overall cognitive status: Within functional limits for tasks assessed                           SENSATION: WFL   MUSCLE LENGTH: Hamstrings: WNL bilat Thomas test: WNL bilat   POSTURE: No Significant postural limitations   PALPATION: TTP at superior glute max/med with palpable tension. Palpable tension to bilat lumbar paraspinals concordant pain to R side   Centralization with repeated ext, and G3 CPA to L4-S3; p. With forward flex   LUMBAR ROM:    All lumbar and BLE ROM WNL with classic centralization to repeated ext; peripheralization with flexion       LOWER EXTREMITY MMT:              5/5 for all lower extremity MMT; EXCEPT hip ext 4+/5 bilat            Bering Sorenson 52sec            Plank 60sec and ceased   LUMBAR SPECIAL TESTS:  Straight leg raise test: Positive, Slump test: Positive, Single leg stance test: Negative, FABER test: Negative, and Thomas test: Negative   FUNCTIONAL TESTS:  Squat assessment next visit   GAIT: Distance walked: 60f Assistive device utilized:  none Level of assistance: Complete Independence Comments: Fairly normalized gait with normal gait speed       TODAY'S TREATMENT  Nustep seat 11 Ues 13 L3 511ms for gentle lumbar motion and strengthening  Lower trunk rotations x20 SKTC x3e 5-10sec Prone press x12-20 Sciatic nerve glide  Prone alt supermans with core activation 2x 12 with good carry over of core activation with cue of being able to "slip a piece of paper under the stomach" Straight leg deadlift with dowel for technique x12 with good carry over of cuing; 15# 2x 8/12 with good carry over of initial demo  Squat with dowel held at occiput and sacrum x12; 2x 10 20# KB with good carry over of initial cuing/demo       PATIENT EDUCATION:  Education details: therex form/technique   Person educated: Patient Education method: Explanation, Verbal cues, and Handouts Education comprehension: verbalized understanding and returned demonstration     HOME EXERCISE PROGRAM: To complete in am prior to getting out of bed: Lower  trunk rotations x20 SKTC x3e 5-10sec Prone press x12-20 Sciatic nerve glide PRN   ASSESSMENT:   CLINICAL IMPRESSION: PT initiated therex progression for increased hip and core strengthening, and lumbopelvic dissociation with success. Pt is able to comply with all cuing for proper technique of therex with excellent carry over of TC for motor control. Pt with good effort throughout session, no increased pain. PT will continue progression as able.        OBJECTIVE IMPAIRMENTS decreased activity tolerance, decreased endurance, decreased mobility, difficulty walking, decreased ROM, decreased strength, increased fascial restrictions, impaired perceived functional ability, impaired flexibility, and pain.  ACTIVITY LIMITATIONS bending, squatting, and cycling   PARTICIPATION LIMITATIONS: cleaning, community activity, yard work, and [place] Past/current experiences, Time since onset of injury/illness/exacerbation, and 1-2 comorbidities: EIA, HLD  are also affecting patient's functional outcome.    REHAB POTENTIAL: Excellent   CLINICAL DECISION MAKING: Evolving/moderate complexity   EVALUATION COMPLEXITY: Moderate     GOALS: Goals reviewed with patient? Yes   SHORT TERM GOALS: Target date: 06/29/2022   Pt will be independent with HEP in order to improve strength and balance in order to decrease fall risk and improve function at home and work.   Baseline:05/28/22 Goal status: INITIAL     LONG TERM GOALS: Target date: 07/27/2022   Patient will increase FOTO score to 72 to demonstrate predicted increase in functional mobility to complete ADLs Baseline: 05/28/22 59 Goal status: INITIAL   2.  Pt will decrease worst pain as reported on NPRS by at least 3 points in order to demonstrate clinically significant reduction in pain. Baseline: 05/29/22 5/10 Goal status: INITIAL   3.  Pt will demonstrate [employer] test of 80sec or more to demonstrate age predicted lumbar  extensor enduranceneeded for road cycling Baseline: 05/28/22 52sec Goal status: INITIAL         PLAN: PT FREQUENCY: 1-2x/week   PT DURATION: 8 weeks   PLANNED INTERVENTIONS: Therapeutic exercises, Therapeutic activity, Neuromuscular re-education, Balance training, Gait training, Patient/Family education, Self Care, Joint mobilization, Joint manipulation, Dry Needling, Electrical stimulation, Spinal manipulation, Spinal mobilization, Cryotherapy, Moist heat, Traction, Manual therapy, and Re-evaluation.   PLAN FOR NEXT SESSION: HEP review;   Durwin Reges DPT Durwin Reges, PT 06/04/2022, 12:21 PM

## 2022-06-06 ENCOUNTER — Encounter: Payer: Self-pay | Admitting: Physical Therapy

## 2022-06-06 ENCOUNTER — Ambulatory Visit: Payer: BC Managed Care – PPO | Admitting: Physical Therapy

## 2022-06-06 DIAGNOSIS — M5416 Radiculopathy, lumbar region: Secondary | ICD-10-CM | POA: Diagnosis not present

## 2022-06-06 DIAGNOSIS — M5459 Other low back pain: Secondary | ICD-10-CM

## 2022-06-06 NOTE — Therapy (Signed)
OUTPATIENT PHYSICAL THERAPY TREATMENT NOTE   Patient Name: Dustin Salazar MRN: [id number] DOB:1960-12-22, 61 y.o., male Today's Date: 06/06/2022  PCP:  Ria Bush MD REFERRING PROVIDER:  Ria Bush MD  END OF SESSION:   PT End of Session - 06/06/22 0923     Visit Number 3    Number of Visits 17    Date for PT Re-Evaluation 07/19/22    Authorization - Visit Number 3    Authorization - Number of Visits 13    Progress Note Due on Visit 10    PT Start Time 0919    PT Stop Time 0957    PT Time Calculation (min) 38 min    Activity Tolerance Patient tolerated treatment well    Behavior During Therapy [facility] for tasks assessed/performed              Past Medical History:  Diagnosis Date   Exercise-induced asthma    Herpes zoster 10/22/2018   History of chicken pox    History of kidney stones 1996   Seasonal allergic rhinitis    Ulcerative colitis ([place])    Past Surgical History:  Procedure Laterality Date   COLONOSCOPY  01/2018   SSA, rpt 2 yrs (Dr Julien Nordmann in [place])   [place]  06/2020   no active colitis or dysplasia, fair prep - rpt colonoscopy 1 yr with more extensive prep ([facility]urs with [place])   COLONOSCOPY  09/2021   WNL, rpt 2 yrs (Furs @ [place])   HERNIA REPAIR Bilateral 2010   [place]     Patient Active Problem List   Diagnosis Date Noted   Chronic right-sided lumbar radiculopathy 01/30/2022   Finger laceration, initial encounter 10/01/2021   Vitamin D deficiency 09/21/2021   Immunosuppression ([place]) 03/02/2021   Arthritis associated with inflammatory bowel disease 09/15/2019   Skin rash 10/22/2018   Hyperglycemia 08/14/2018   Health maintenance examination 08/12/2017   Exercise-induced asthma    Seasonal allergic rhinitis    Ulcerative colitis ([place])    Hyperlipidemia, unspecified 12/26/2013    REFERRING DIAG: LBP  THERAPY DIAG:  Other low back  pain  Chronic right-sided lumbar radiculopathy  Rationale for Evaluation and Treatment Rehabilitation  PERTINENT HISTORY: Pt is a 61 year old male presenting with LBP with R radicular pain since Feb 2023. Reports he started having pain after tree pruning, golfing, and cycling. Feels like pain flared up over these activities. He has baseline of LBP with worsening radicular pain in the am in the first half an hour-hour after waking all the way to the foot. Reports he has "buzzing" sensation in the R buttock at baseline, but that it will travel down the leg with forward bending and rotational movements to the L. 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 Pt has not golfed since injury due to pain with rotation of swing. He has tried cycling, is able to complete on flat in [place], but stood to cycle up hill and that brought on his pain. Current and best pain 0.5/19; worst 3/10. Pain is better with extension. Pt denies N/V, B&B changes, unexplained weight fluctuation, saddle paresthesia, fever, night sweats, or unrelenting night pain at this time.    PRECAUTIONS: none  SUBJECTIVE: Pt reports no pain or buzzing currently. Still having this in the am, resolves quickly with exercises.  PAIN:  Are you having pain? Yes: NPRS scale: 0/10  Pain location: R sided LB with RLE radiculopathy  Pain description: electrical, "buzzing"   OBJECTIVE: (objective measures completed at initial evaluation unless otherwise dated)  OBJECTIVE:    DIAGNOSTIC FINDINGS:  04/25/22 Mild to moderate severity multilevel degenerative changes in the lumbar spine.   PATIENT SURVEYS:  FOTO 59 with goal of 72   SCREENING FOR RED FLAGS: Bowel or bladder incontinence: No Spinal tumors: No Cauda equina syndrome: No Compression fracture: No Abdominal aneurysm: No   COGNITION:           Overall cognitive status: Within functional limits for tasks assessed                          SENSATION: WFL    MUSCLE LENGTH: Hamstrings: WNL bilat Thomas test: WNL bilat   POSTURE: No Significant postural limitations   PALPATION: TTP at superior glute max/med with palpable tension. Palpable tension to bilat lumbar paraspinals concordant pain to R side   Centralization with repeated ext, and G3 CPA to L4-S3; p. With forward flex   LUMBAR ROM:    All lumbar and BLE ROM WNL with classic centralization to repeated ext; peripheralization with flexion       LOWER EXTREMITY MMT:              5/5 for all lower extremity MMT; EXCEPT hip ext 4+/5 bilat            Bering Sorenson 52sec            Plank 60sec and ceased   LUMBAR SPECIAL TESTS:  Straight leg raise test: Positive, Slump test: Positive, Single leg stance test: Negative, FABER test: Negative, and Thomas test: Negative   FUNCTIONAL TESTS:  Squat assessment next visit   GAIT: Distance walked: 5f Assistive device utilized:  none Level of assistance: Complete Independence Comments: Fairly normalized gait with normal gait speed       TODAY'S TREATMENT  Nustep seat 9 UEs 13 L3 526ms for gentle lumbar motion and strengthening  Prone alt supermans with core activation 2x 12 with good carry over of core activation with cue of being able to "slip a piece of paper under the stomach" Deadbug 2x 12 with ball used for TC for technique Straight leg deadlift 20# KB x12; Single leg straight leg deadlift 20# KB 2x 10 bilat with cuing for core activation with good carry over Squat with 10# DB outstretched for increased core activation 2x 10        PATIENT EDUCATION:  Education details: therex form/technique   Person educated: Patient Education method: ExConsulting civil engineerVerbal cues, and Handouts Education comprehension: verbalized understanding and returned demonstration     HOME EXERCISE PROGRAM: To complete in am prior to getting out of bed: Lower trunk rotations x20 SKTC x3e 5-10sec Prone press x12-20 Sciatic nerve glide PRN    ASSESSMENT:   CLINICAL IMPRESSION: PT initiated therex progression for increased hip and core strengthening, and lumbopelvic dissociation with success. Pt is able to comply with all cuing for proper technique of therex with excellent carry over of TC for motor control. Pt with good effort throughout session, no increased pain. PT will progress therex with rotations demand next session. PT will continue progression as able.        OBJECTIVE IMPAIRMENTS decreased activity tolerance, decreased endurance, decreased mobility, difficulty walking, decreased ROM, decreased strength, increased fascial restrictions, impaired perceived functional ability, impaired flexibility, and pain.    ACTIVITY LIMITATIONS bending, squatting, and  cycling   PARTICIPATION LIMITATIONS: cleaning, community activity, yard work, and [employer] information [employer]   PERSONAL FACTORS Past/current experiences, Time since onset of injury/illness/exacerbation, and 1-2 comorbidities: EIA, HLD  are also affecting patient's functional outcome.    REHAB POTENTIAL: Excellent   CLINICAL DECISION MAKING: Evolving/moderate complexity   EVALUATION COMPLEXITY: Moderate     GOALS: Goals reviewed with patient? Yes   SHORT TERM GOALS: Target date: 06/29/2022   Pt will be independent with HEP in order to improve strength and balance in order to decrease fall risk and improve function at home and work.   Baseline:05/28/22 Goal status: INITIAL     LONG TERM GOALS: Target date: 07/27/2022   Patient will increase FOTO score to 72 to demonstrate predicted increase in functional mobility to complete ADLs Baseline: 05/28/22 59 Goal status: INITIAL   2.  Pt will decrease worst pain as reported on NPRS by at least 3 points in order to demonstrate clinically significant reduction in pain. Baseline: 05/29/22 5/10 Goal status: INITIAL   3.  Pt will demonstrate [employer] test of 80sec or more to demonstrate age predicted lumbar extensor enduranceneeded for  road cycling Baseline: 05/28/22 52sec Goal status: INITIAL         PLAN: PT FREQUENCY: 1-2x/week   PT DURATION: 8 weeks   PLANNED INTERVENTIONS: Therapeutic exercises, Therapeutic activity, Neuromuscular re-education, Balance training, Gait training, Patient/Family education, Self Care, Joint mobilization, Joint manipulation, Dry Needling, Electrical stimulation, Spinal manipulation, Spinal mobilization, Cryotherapy, Moist heat, Traction, Manual therapy, and Re-evaluation.   PLAN FOR NEXT SESSION: HEP review;   Durwin Reges DPT Durwin Reges, PT 06/06/2022, 11:12 AM

## 2022-06-11 ENCOUNTER — Ambulatory Visit: Payer: BC Managed Care – PPO | Admitting: Physical Therapy

## 2022-06-11 ENCOUNTER — Encounter: Payer: Self-pay | Admitting: Physical Therapy

## 2022-06-11 DIAGNOSIS — M5459 Other low back pain: Secondary | ICD-10-CM | POA: Diagnosis not present

## 2022-06-11 DIAGNOSIS — M5416 Radiculopathy, lumbar region: Secondary | ICD-10-CM | POA: Diagnosis not present

## 2022-06-11 NOTE — Therapy (Signed)
OUTPATIENT PHYSICAL THERAPY TREATMENT NOTE   Patient Name: Dustin Salazar MRN: [id number] DOB:04-13-1961, 61 y.o., male Today's Date: 06/11/2022  PCP:  Ria Bush MD REFERRING PROVIDER:  Ria Bush MD  END OF SESSION:   PT End of Session - 06/11/22 1052     Visit Number 4    Number of Visits 17    Date for PT Re-Evaluation 07/19/22    Authorization - Visit Number 4    Authorization - Number of Visits 13    Progress Note Due on Visit 10    PT Start Time 9211    PT Stop Time 1127    PT Time Calculation (min) 38 min    Activity Tolerance Patient tolerated treatment well    Behavior During Therapy [facility] for tasks assessed/performed               Past Medical History:  Diagnosis Date   Exercise-induced asthma    Herpes zoster 10/22/2018   History of chicken pox    History of kidney stones 1996   Seasonal allergic rhinitis    Ulcerative colitis ([place])    Past Surgical History:  Procedure Laterality Date   COLONOSCOPY  01/2018   SSA, rpt 2 yrs (Dr Julien Nordmann in [place])   COLONOSCOPY  06/2020   no active colitis or dysplasia, fair prep - rpt colonoscopy 1 yr with more extensive prep ([facility]urs with [place])   COLONOSCOPY  09/2021   WNL, rpt 2 yrs (Furs @ [place])   HERNIA REPAIR Bilateral 2010   [place]     Patient Active Problem List   Diagnosis Date Noted   Chronic right-sided lumbar radiculopathy 01/30/2022   Finger laceration, initial encounter 10/01/2021   Vitamin D deficiency 09/21/2021   Immunosuppression ([place]) 03/02/2021   Arthritis associated with inflammatory bowel disease 09/15/2019   Skin rash 10/22/2018   Hyperglycemia 08/14/2018   Health maintenance examination 08/12/2017   Exercise-induced asthma    Seasonal allergic rhinitis    Ulcerative colitis ([place])    Hyperlipidemia, unspecified 12/26/2013    REFERRING DIAG: LBP  THERAPY DIAG:  Other low back  pain  Chronic right-sided lumbar radiculopathy  Rationale for Evaluation and Treatment Rehabilitation  PERTINENT HISTORY: Pt is a 61 year old male presenting with LBP with R radicular pain since Feb 2023. Reports he started having pain after tree pruning, golfing, and cycling. Feels like pain flared up over these activities. He has baseline of LBP with worsening radicular pain in the am in the first half an hour-hour after waking all the way to the foot. Reports he has "buzzing" sensation in the R buttock at baseline, but that it will travel down the leg with forward bending and rotational movements to the L. 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 Pt has not golfed since injury due to pain with rotation of swing. He has tried cycling, is able to complete on flat in [place], but stood to cycle up hill and that brought on his pain. Current and best pain 0.5/19; worst 3/10. Pain is better with extension. Pt denies N/V, B&B changes, unexplained weight fluctuation, saddle paresthesia, fever, night sweats, or unrelenting night pain at this time.    PRECAUTIONS: none  SUBJECTIVE: Pt reports no pain or buzzing currently. Still having this in the am, resolves quickly with exercises- this resolves in 60mns. Compliance with HEP.   PAIN:  Are you having pain? Yes: NPRS scale: 0/10 Pain location: R sided LB with RLE radiculopathy  Pain description: electrical, "buzzing"   OBJECTIVE: (objective measures completed at initial evaluation unless otherwise dated)  OBJECTIVE:    DIAGNOSTIC FINDINGS:  04/25/22 Mild to moderate severity multilevel degenerative changes in the lumbar spine.   PATIENT SURVEYS:  FOTO 59 with goal of 72   SCREENING FOR RED FLAGS: Bowel or bladder incontinence: No Spinal tumors: No Cauda equina syndrome: No Compression fracture: No Abdominal aneurysm: No   COGNITION:           Overall cognitive status: Within functional limits for tasks assessed                           SENSATION: WFL   MUSCLE LENGTH: Hamstrings: WNL bilat Thomas test: WNL bilat   POSTURE: No Significant postural limitations   PALPATION: TTP at superior glute max/med with palpable tension. Palpable tension to bilat lumbar paraspinals concordant pain to R side   Centralization with repeated ext, and G3 CPA to L4-S3; p. With forward flex   LUMBAR ROM:    All lumbar and BLE ROM WNL with classic centralization to repeated ext; peripheralization with flexion       LOWER EXTREMITY MMT:              5/5 for all lower extremity MMT; EXCEPT hip ext 4+/5 bilat            Bering Sorenson 52sec            Plank 60sec and ceased   LUMBAR SPECIAL TESTS:  Straight leg raise test: Positive, Slump test: Positive, Single leg stance test: Negative, FABER test: Negative, and Thomas test: Negative   FUNCTIONAL TESTS:  Squat assessment next visit   GAIT: Distance walked: 20f Assistive device utilized:  none Level of assistance: Complete Independence Comments: Fairly normalized gait with normal gait speed       TODAY'S TREATMENT  Nustep seat 9 UEs 13 L3 570ms for gentle lumbar motion and strengthening  Single leg straight leg deadlift 20# KB 2x 10 bilat with cuing for core activation with good carry over R Single leg stance 20# KB 2x 10 rotations (completed 1 set on LLE to demonstrate increased balance on L > R)  P[place]off anti-rotation R 10# x10; 15# 2x 10 with good carry over of demo for technique R wood chops 10# x10; on R SLS x8 with good carry over of demo  Childs pose on theraball x30sec; lateral each way x30sec R glute stretch x30sec     PATIENT EDUCATION:  Education details: therex form/technique   Person educated: Patient Education method: ExConsulting civil engineerVerbal cues, and Handouts Education comprehension: verbalized understanding and returned demonstration     HOME EXERCISE PROGRAM: To complete in am prior to getting out of bed: Lower trunk rotations  x20 SKTC x3e 5-10sec Prone press x12-20 Sciatic nerve glide PRN   ASSESSMENT:   CLINICAL IMPRESSION: PT initiated therex progression for increased hip and core strengthening, and lumbopelvic dissociation with success. PT progressed with combined movements with rotational focus with patient reporting no increased pain with these.Pt is able to comply with all cuing for proper technique of therex with evident decreased stability in challenged R SLS positions >L. PT will continue progression as able.        OBJECTIVE IMPAIRMENTS decreased activity tolerance, decreased endurance, decreased mobility, difficulty walking, decreased ROM, decreased strength, increased fascial restrictions, impaired  perceived functional ability, impaired flexibility, and pain.    ACTIVITY LIMITATIONS bending, squatting, and cycling   PARTICIPATION LIMITATIONS: cleaning, community activity, yard work, and [place] Past/current experiences, Time since onset of injury/illness/exacerbation, and 1-2 comorbidities: EIA, HLD  are also affecting patient's functional outcome.    REHAB POTENTIAL: Excellent   CLINICAL DECISION MAKING: Evolving/moderate complexity   EVALUATION COMPLEXITY: Moderate     GOALS: Goals reviewed with patient? Yes   SHORT TERM GOALS: Target date: 06/29/2022   Pt will be independent with HEP in order to improve strength and balance in order to decrease fall risk and improve function at home and work.   Baseline:05/28/22 Goal status: INITIAL     LONG TERM GOALS: Target date: 07/27/2022   Patient will increase FOTO score to 72 to demonstrate predicted increase in functional mobility to complete ADLs Baseline: 05/28/22 59 Goal status: INITIAL   2.  Pt will decrease worst pain as reported on NPRS by at least 3 points in order to demonstrate clinically significant reduction in pain. Baseline: 05/29/22 5/10 Goal status: INITIAL   3.  Pt will demonstrate [employer] test of  80sec or more to demonstrate age predicted lumbar extensor enduranceneeded for road cycling Baseline: 05/28/22 52sec Goal status: INITIAL         PLAN: PT FREQUENCY: 1-2x/week   PT DURATION: 8 weeks   PLANNED INTERVENTIONS: Therapeutic exercises, Therapeutic activity, Neuromuscular re-education, Balance training, Gait training, Patient/Family education, Self Care, Joint mobilization, Joint manipulation, Dry Needling, Electrical stimulation, Spinal manipulation, Spinal mobilization, Cryotherapy, Moist heat, Traction, Manual therapy, and Re-evaluation.   PLAN FOR NEXT SESSION: HEP review;   Durwin Reges DPT Durwin Reges, PT 06/11/2022, 11:31 AM

## 2022-06-13 ENCOUNTER — Ambulatory Visit: Payer: BC Managed Care – PPO | Admitting: Physical Therapy

## 2022-06-13 ENCOUNTER — Encounter: Payer: Self-pay | Admitting: Physical Therapy

## 2022-06-13 DIAGNOSIS — M5459 Other low back pain: Secondary | ICD-10-CM

## 2022-06-13 DIAGNOSIS — M5416 Radiculopathy, lumbar region: Secondary | ICD-10-CM | POA: Diagnosis not present

## 2022-06-13 NOTE — Therapy (Signed)
OUTPATIENT PHYSICAL THERAPY TREATMENT NOTE   Patient Name: Dustin Salazar MRN: [id number] DOB:October 13, 1961, 61 y.o., male Today's Date: 06/13/2022  PCP:  Ria Bush MD REFERRING PROVIDER:  Ria Bush MD  END OF SESSION:   PT End of Session - 06/13/22 0950     Visit Number 5    Number of Visits 17    Date for PT Re-Evaluation 07/19/22    Authorization - Visit Number 5    Authorization - Number of Visits 13    Progress Note Due on Visit 10    PT Start Time 0919    PT Stop Time 0950    PT Time Calculation (min) 31 min    Activity Tolerance Patient tolerated treatment well    Behavior During Therapy [facility] for tasks assessed/performed                Past Medical History:  Diagnosis Date   Exercise-induced asthma    Herpes zoster 10/22/2018   History of chicken pox    History of kidney stones 1996   Seasonal allergic rhinitis    Ulcerative colitis ([place])    Past Surgical History:  Procedure Laterality Date   COLONOSCOPY  01/2018   SSA, rpt 2 yrs (Dr Julien Nordmann in [place])   COLONOSCOPY  06/2020   no active colitis or dysplasia, fair prep - rpt colonoscopy 1 yr with more extensive prep ([facility] with [place])   COLONOSCOPY  09/2021   WNL, rpt 2 yrs ([facility] @ [place])   HERNIA REPAIR Bilateral 2010   [place]     Patient Active Problem List   Diagnosis Date Noted   Chronic right-sided lumbar radiculopathy 01/30/2022   Finger laceration, initial encounter 10/01/2021   Vitamin D deficiency 09/21/2021   Immunosuppression ([place]) 03/02/2021   Arthritis associated with inflammatory bowel disease 09/15/2019   Skin rash 10/22/2018   Hyperglycemia 08/14/2018   Health maintenance examination 08/12/2017   Exercise-induced asthma    Seasonal allergic rhinitis    Ulcerative colitis ([place])    Hyperlipidemia, unspecified 12/26/2013    REFERRING DIAG: LBP  THERAPY DIAG:  Other low back  pain  Chronic right-sided lumbar radiculopathy  Rationale for Evaluation and Treatment Rehabilitation  PERTINENT HISTORY: Pt is a 61 year old male presenting with LBP with R radicular pain since Feb 2023. Reports he started having pain after tree pruning, golfing, and cycling. Feels like pain flared up over these activities. He has baseline of LBP with worsening radicular pain in the am in the first half an hour-hour after waking all the way to the foot. Reports he has "buzzing" sensation in the R buttock at baseline, but that it will travel down the leg with forward bending and rotational movements to the L. 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 Pt has not golfed since injury due to pain with rotation of swing. He has tried cycling, is able to complete on flat in [place], but stood to cycle up hill and that brought on his pain. Current and best pain 0.5/19; worst 3/10. Pain is better with extension. Pt denies N/V, B&B changes, unexplained weight fluctuation, saddle paresthesia, fever, night sweats, or unrelenting night pain at this time.    PRECAUTIONS: none  SUBJECTIVE: Pt reports no pain or buzzing currently. Still having this in the am, resolves quickly with exercises- this resolves in 85mns. Compliance with HEP.  PAIN:  Are you having pain? Yes: NPRS scale: 0/10 Pain location: R sided LB with RLE radiculopathy  Pain description: electrical, "buzzing"   OBJECTIVE: (objective measures completed at initial evaluation unless otherwise dated)  OBJECTIVE:    DIAGNOSTIC FINDINGS:  04/25/22 Mild to moderate severity multilevel degenerative changes in the lumbar spine.   PATIENT SURVEYS:  FOTO 59 with goal of 72   SCREENING FOR RED FLAGS: Bowel or bladder incontinence: No Spinal tumors: No Cauda equina syndrome: No Compression fracture: No Abdominal aneurysm: No   COGNITION:           Overall cognitive status: Within functional limits for tasks assessed                           SENSATION: WFL   MUSCLE LENGTH: Hamstrings: WNL bilat Thomas test: WNL bilat   POSTURE: No Significant postural limitations   PALPATION: TTP at superior glute max/med with palpable tension. Palpable tension to bilat lumbar paraspinals concordant pain to R side   Centralization with repeated ext, and G3 CPA to L4-S3; p. With forward flex   LUMBAR ROM:    All lumbar and BLE ROM WNL with classic centralization to repeated ext; peripheralization with flexion       LOWER EXTREMITY MMT:              5/5 for all lower extremity MMT; EXCEPT hip ext 4+/5 bilat            Bering Sorenson 52sec            Plank 60sec and ceased   LUMBAR SPECIAL TESTS:  Straight leg raise test: Positive, Slump test: Positive, Single leg stance test: Negative, FABER test: Negative, and Thomas test: Negative   FUNCTIONAL TESTS:  Squat assessment next visit   GAIT: Distance walked: 69f Assistive device utilized:  none Level of assistance: Complete Independence Comments: Fairly normalized gait with normal gait speed       TODAY'S TREATMENT  Nustep seat 9 UEs 13 L3 540ms for gentle lumbar motion and strengthening  Bering Sorenson 85sec PT educated patient on parameters of therex (how/when to inc/decrease intensity, frequency, rep/set range, stretch hold time, and purpose of therex) with verbalized understanding.   Access Code: [place] with Weight  - 1 x daily - 1-2 x weekly - 3 sets - 10-12 reps - Anti-Rotation Press With Sidesteps and Anchored Resistance  - 1 x daily - 1-2 x weekly - 3 sets - 10-12 reps - Standing Single Arm Shoulder PNF D1 Flexion with Anchored Resistance  - 1 x daily - 1-2 x weekly - 3 sets - 10-12 reps - Single leg around the worlds 1 x daily - 1-2 x weekly - 3 sets - 10-12 reps  Verbal Review of am HEP     PATIENT EDUCATION:  Education details: therex form/technique   Person educated: Patient Education method: Explanation, Verbal  cues, and Handouts Education comprehension: verbalized understanding and returned demonstration     HOME EXERCISE PROGRAM: To complete in am prior to getting out of bed: Lower trunk rotations x20 SKTC x3e 5-10sec Prone press x12-20 Sciatic nerve glide PRN   Strengthening HEP: [id number]  ASSESSMENT:   CLINICAL IMPRESSION: PT reassessed goals this session where patient has met all goals to safely d/c formal PT. Patient is able to demonstrate and verbalize understanding of all HEP recommendations with minimal corrections needed. Pt given clinic contact info should further questions  or concerns arise. Pt to d/c PT.         OBJECTIVE IMPAIRMENTS decreased activity tolerance, decreased endurance, decreased mobility, difficulty walking, decreased ROM, decreased strength, increased fascial restrictions, impaired perceived functional ability, impaired flexibility, and pain.    ACTIVITY LIMITATIONS bending, squatting, and cycling   PARTICIPATION LIMITATIONS: cleaning, community activity, yard work, and [place] Past/current experiences, Time since onset of injury/illness/exacerbation, and 1-2 comorbidities: EIA, HLD  are also affecting patient's functional outcome.    REHAB POTENTIAL: Excellent   CLINICAL DECISION MAKING: Evolving/moderate complexity   EVALUATION COMPLEXITY: Moderate     GOALS: Goals reviewed with patient? Yes   SHORT TERM GOALS: Target date: 06/29/2022   Pt will be independent with HEP in order to improve strength and balance in order to decrease fall risk and improve function at home and work.   Baseline:05/28/22; 06/13/22 Completing HEP Goal status: ACHIEVED      LONG TERM GOALS: Target date: 07/27/2022   Patient will increase FOTO score to 72 to demonstrate predicted increase in functional mobility to complete ADLs Baseline: 05/28/22 59; 06/13/22 69 Goal status: ACHIEVED    2.  Pt will decrease worst pain as reported on NPRS by at least 3 points  in order to demonstrate clinically significant reduction in pain. Baseline: 05/29/22 5/10; 06/13/22 1/10 first thing in the am Goal status: ACHIEVED    3.  Pt will demonstrate [employer] test of 80sec or more to demonstrate age predicted lumbar extensor enduranceneeded for road cycling Baseline: 05/28/22 52sec; 06/13/22 85sec Goal status: ACHIEVED       PLAN: PT FREQUENCY: 1-2x/week   PT DURATION: 8 weeks   PLANNED INTERVENTIONS: Therapeutic exercises, Therapeutic activity, Neuromuscular re-education, Balance training, Gait training, Patient/Family education, Self Care, Joint mobilization, Joint manipulation, Dry Needling, Electrical stimulation, Spinal manipulation, Spinal mobilization, Cryotherapy, Moist heat, Traction, Manual therapy, and Re-evaluation.   PLAN FOR NEXT SESSION: HEP review;   Durwin Reges DPT Durwin Reges, PT 06/13/2022, 9:53 AM

## 2022-06-14 ENCOUNTER — Other Ambulatory Visit (INDEPENDENT_AMBULATORY_CARE_PROVIDER_SITE_OTHER): Payer: BC Managed Care – PPO

## 2022-06-14 DIAGNOSIS — E559 Vitamin D deficiency, unspecified: Secondary | ICD-10-CM | POA: Diagnosis not present

## 2022-06-14 DIAGNOSIS — D849 Immunodeficiency, unspecified: Secondary | ICD-10-CM

## 2022-06-14 DIAGNOSIS — K5190 Ulcerative colitis, unspecified, without complications: Secondary | ICD-10-CM

## 2022-06-14 LAB — COMPREHENSIVE METABOLIC PANEL
ALT: 26 U/L (ref 0–53)
AST: 23 U/L (ref 0–37)
Albumin: 4.3 g/dL (ref 3.5–5.2)
Alkaline Phosphatase: 57 U/L (ref 39–117)
BUN: 10 mg/dL (ref 6–23)
CO2: 31 mEq/L (ref 19–32)
Calcium: 9.2 mg/dL (ref 8.4–10.5)
Chloride: 103 mEq/L (ref 96–112)
Creatinine, Ser: 1.01 mg/dL (ref 0.40–1.50)
GFR: 81.02 mL/min (ref >60.00)
Glucose, Bld: 95 mg/dL (ref 70–99)
Potassium: 4.5 mEq/L (ref 3.5–5.1)
Sodium: 138 mEq/L (ref 135–145)
Total Bilirubin: 0.7 mg/dL (ref 0.2–1.2)
Total Protein: 6.5 g/dL (ref 6.0–8.3)

## 2022-06-14 LAB — CBC WITH DIFFERENTIAL/PLATELET
Basophils Absolute: 0.1 10*3/uL (ref 0.0–0.1)
Basophils Relative: 1 % (ref 0.0–3.0)
Eosinophils Absolute: 0.1 10*3/uL (ref 0.0–0.7)
Eosinophils Relative: 1.9 % (ref 0.0–5.0)
HCT: 46.9 % (ref 39.0–52.0)
Hemoglobin: 15.7 g/dL (ref 13.0–17.0)
Lymphocytes Relative: 27.3 % (ref 12.0–46.0)
Lymphs Abs: 1.3 10*3/uL (ref 0.7–4.0)
MCHC: 33.5 g/dL (ref 30.0–36.0)
MCV: 95.1 fl (ref 78.0–100.0)
Monocytes Absolute: 0.5 10*3/uL (ref 0.1–1.0)
Monocytes Relative: 10.9 % (ref 3.0–12.0)
Neutro Abs: 2.9 10*3/uL (ref 1.4–7.7)
Neutrophils Relative %: 58.9 % (ref 43.0–77.0)
Platelets: 245 10*3/uL (ref 150.0–400.0)
RBC: 4.93 Mil/uL (ref 4.22–5.81)
RDW: 13.6 % (ref 11.5–15.5)
WBC: 4.9 10*3/uL (ref 4.0–10.5)

## 2022-06-14 LAB — VITAMIN D 25 HYDROXY (VIT D DEFICIENCY, FRACTURES): VITD: 37.64 ng/mL (ref 30.00–100.00)

## 2022-06-14 LAB — SEDIMENTATION RATE: Sed Rate: 4 mm/hr (ref 0–20)

## 2022-06-18 ENCOUNTER — Encounter: Payer: BC Managed Care – PPO | Admitting: Physical Therapy

## 2022-06-20 ENCOUNTER — Encounter: Payer: BC Managed Care – PPO | Admitting: Physical Therapy

## 2022-06-25 ENCOUNTER — Encounter: Payer: BC Managed Care – PPO | Admitting: Physical Therapy

## 2022-06-27 ENCOUNTER — Encounter: Payer: BC Managed Care – PPO | Admitting: Physical Therapy

## 2022-07-01 ENCOUNTER — Encounter: Payer: BC Managed Care – PPO | Admitting: Physical Therapy

## 2022-07-03 ENCOUNTER — Encounter: Payer: BC Managed Care – PPO | Admitting: Physical Therapy

## 2022-07-08 ENCOUNTER — Encounter: Payer: BC Managed Care – PPO | Admitting: Physical Therapy

## 2022-07-11 ENCOUNTER — Encounter: Payer: BC Managed Care – PPO | Admitting: Physical Therapy

## 2022-07-15 ENCOUNTER — Encounter: Payer: BC Managed Care – PPO | Admitting: Physical Therapy

## 2022-07-17 ENCOUNTER — Other Ambulatory Visit: Payer: Self-pay | Admitting: Family Medicine

## 2022-07-17 DIAGNOSIS — E785 Hyperlipidemia, unspecified: Secondary | ICD-10-CM

## 2022-07-18 ENCOUNTER — Encounter: Payer: BC Managed Care – PPO | Admitting: Physical Therapy

## 2022-07-18 NOTE — Telephone Encounter (Signed)
Azathioprine last filled: 04/20/22, #180 Celebrex last filled:  04/20/22, #180 Mesalamine last filled:  04/20/22, #360 Last OV:  04/24/22, chronic back pain Next OV:  09/23/22, CPE

## 2022-07-21 NOTE — Telephone Encounter (Signed)
ERx 

## 2022-09-16 ENCOUNTER — Other Ambulatory Visit: Payer: Self-pay | Admitting: Family Medicine

## 2022-09-16 DIAGNOSIS — E785 Hyperlipidemia, unspecified: Secondary | ICD-10-CM

## 2022-09-16 DIAGNOSIS — K5190 Ulcerative colitis, unspecified, without complications: Secondary | ICD-10-CM

## 2022-09-16 DIAGNOSIS — E559 Vitamin D deficiency, unspecified: Secondary | ICD-10-CM

## 2022-09-16 DIAGNOSIS — R739 Hyperglycemia, unspecified: Secondary | ICD-10-CM

## 2022-09-16 DIAGNOSIS — Z125 Encounter for screening for malignant neoplasm of prostate: Secondary | ICD-10-CM

## 2022-09-17 ENCOUNTER — Other Ambulatory Visit: Payer: BC Managed Care – PPO

## 2022-09-23 ENCOUNTER — Encounter: Payer: BC Managed Care – PPO | Admitting: Family Medicine

## 2022-09-30 ENCOUNTER — Other Ambulatory Visit: Payer: Self-pay | Admitting: Family Medicine

## 2022-09-30 DIAGNOSIS — E785 Hyperlipidemia, unspecified: Secondary | ICD-10-CM

## 2022-09-30 NOTE — Telephone Encounter (Signed)
E-scribed refill.  Plz schedule CPE and lab visits for additional refills.  

## 2022-10-01 MED ORDER — ATORVASTATIN CALCIUM 20 MG PO TABS: ORAL_TABLET | ORAL | 1 refills | Status: DC

## 2022-10-01 NOTE — Telephone Encounter (Signed)
Patient moved to [facility] and will stay with Dr. Darnell Level until March. He is waiting to establish care at that office with Dr. Hassell Done.

## 2022-10-01 NOTE — Telephone Encounter (Signed)
Noted.  Fyi to Dr. G.  

## 2022-10-01 NOTE — Addendum Note (Signed)
Addended by: Ria Bush on: 10/01/2022 05:28 PM   Modules accepted: Orders

## 2022-10-09 ENCOUNTER — Encounter: Payer: Self-pay | Admitting: Family Medicine

## 2022-10-10 NOTE — Telephone Encounter (Signed)
Darrow Bussing, pharmacy tech, of [facility] asking about recent rx sent on 10/01/22. She confirms they received the request and is showing it's going out as generic atorvastatin.  I will relay above info to pt via MyChart.

## 2022-12-23 DIAGNOSIS — M47816 Spondylosis without myelopathy or radiculopathy, lumbar region: Secondary | ICD-10-CM | POA: Diagnosis not present

## 2022-12-23 DIAGNOSIS — K5190 Ulcerative colitis, unspecified, without complications: Secondary | ICD-10-CM | POA: Diagnosis not present

## 2022-12-23 DIAGNOSIS — M1990 Unspecified osteoarthritis, unspecified site: Secondary | ICD-10-CM | POA: Diagnosis not present

## 2022-12-23 DIAGNOSIS — J45909 Unspecified asthma, uncomplicated: Secondary | ICD-10-CM | POA: Diagnosis not present

## 2022-12-25 ENCOUNTER — Other Ambulatory Visit: Payer: Self-pay | Admitting: Family Medicine

## 2022-12-25 DIAGNOSIS — E785 Hyperlipidemia, unspecified: Secondary | ICD-10-CM

## 2022-12-25 NOTE — Telephone Encounter (Signed)
E-scribed refill.  Plz schedule CPE and labs (fasting) for additional refills.

## 2022-12-26 NOTE — Telephone Encounter (Signed)
Noted. Spoke with Hilda Blades, [facility] pharmacist, to c/x atorvastatin refill due to pt has moved and no longer under Dr. Synthia Innocent care. I also asked her to c/x all other rxs on file showing Dr. Darnell Level as prescriber for pt. She verbalizes understanding and documented info in pt's file.

## 2022-12-26 NOTE — Telephone Encounter (Signed)
Spoke with patient and he stated that he moved to [place] and have a provider there.

## 2023-01-02 DIAGNOSIS — K5190 Ulcerative colitis, unspecified, without complications: Secondary | ICD-10-CM | POA: Diagnosis not present

## 2023-01-02 DIAGNOSIS — Z125 Encounter for screening for malignant neoplasm of prostate: Secondary | ICD-10-CM | POA: Diagnosis not present

## 2023-01-02 DIAGNOSIS — E782 Mixed hyperlipidemia: Secondary | ICD-10-CM | POA: Diagnosis not present

## 2023-02-07 DIAGNOSIS — K5190 Ulcerative colitis, unspecified, without complications: Secondary | ICD-10-CM | POA: Diagnosis not present

## 2023-02-07 DIAGNOSIS — Z1211 Encounter for screening for malignant neoplasm of colon: Secondary | ICD-10-CM | POA: Diagnosis not present

## 2023-02-26 DIAGNOSIS — D849 Immunodeficiency, unspecified: Secondary | ICD-10-CM | POA: Diagnosis not present

## 2023-02-26 DIAGNOSIS — E782 Mixed hyperlipidemia: Secondary | ICD-10-CM | POA: Diagnosis not present

## 2023-02-26 DIAGNOSIS — M47816 Spondylosis without myelopathy or radiculopathy, lumbar region: Secondary | ICD-10-CM | POA: Diagnosis not present

## 2023-02-26 DIAGNOSIS — I2510 Atherosclerotic heart disease of native coronary artery without angina pectoris: Secondary | ICD-10-CM | POA: Diagnosis not present

## 2023-04-02 DIAGNOSIS — J45990 Exercise induced bronchospasm: Secondary | ICD-10-CM | POA: Diagnosis not present

## 2023-04-02 DIAGNOSIS — D849 Immunodeficiency, unspecified: Secondary | ICD-10-CM | POA: Diagnosis not present

## 2023-04-02 DIAGNOSIS — K51819 Other ulcerative colitis with unspecified complications: Secondary | ICD-10-CM | POA: Diagnosis not present

## 2023-04-02 DIAGNOSIS — Z0000 Encounter for general adult medical examination without abnormal findings: Secondary | ICD-10-CM | POA: Diagnosis not present

## 2023-07-03 DIAGNOSIS — E782 Mixed hyperlipidemia: Secondary | ICD-10-CM | POA: Diagnosis not present

## 2023-07-22 DIAGNOSIS — K5190 Ulcerative colitis, unspecified, without complications: Secondary | ICD-10-CM | POA: Diagnosis not present

## 2023-07-22 DIAGNOSIS — R945 Abnormal results of liver function studies: Secondary | ICD-10-CM | POA: Diagnosis not present

## 2023-07-30 DIAGNOSIS — H25813 Combined forms of age-related cataract, bilateral: Secondary | ICD-10-CM | POA: Diagnosis not present

## 2023-08-07 DIAGNOSIS — M47816 Spondylosis without myelopathy or radiculopathy, lumbar region: Secondary | ICD-10-CM | POA: Diagnosis not present

## 2023-08-10 IMAGING — CR DG FINGER INDEX 2+V*R*
3 series · 3 of 3 positions shown · non-contrast
Comparison: September 15, 2019

CLINICAL DATA: Laceration of the right index finger.

EXAM:
RIGHT INDEX FINGER 2+V

[finger ap]
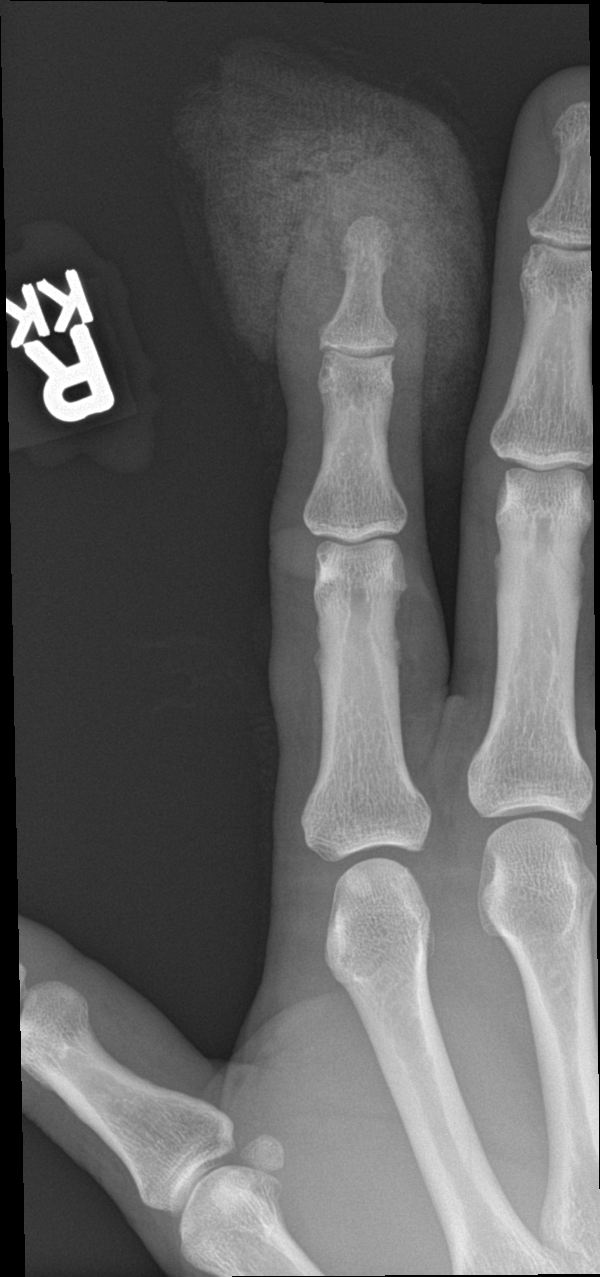

[finger obl]
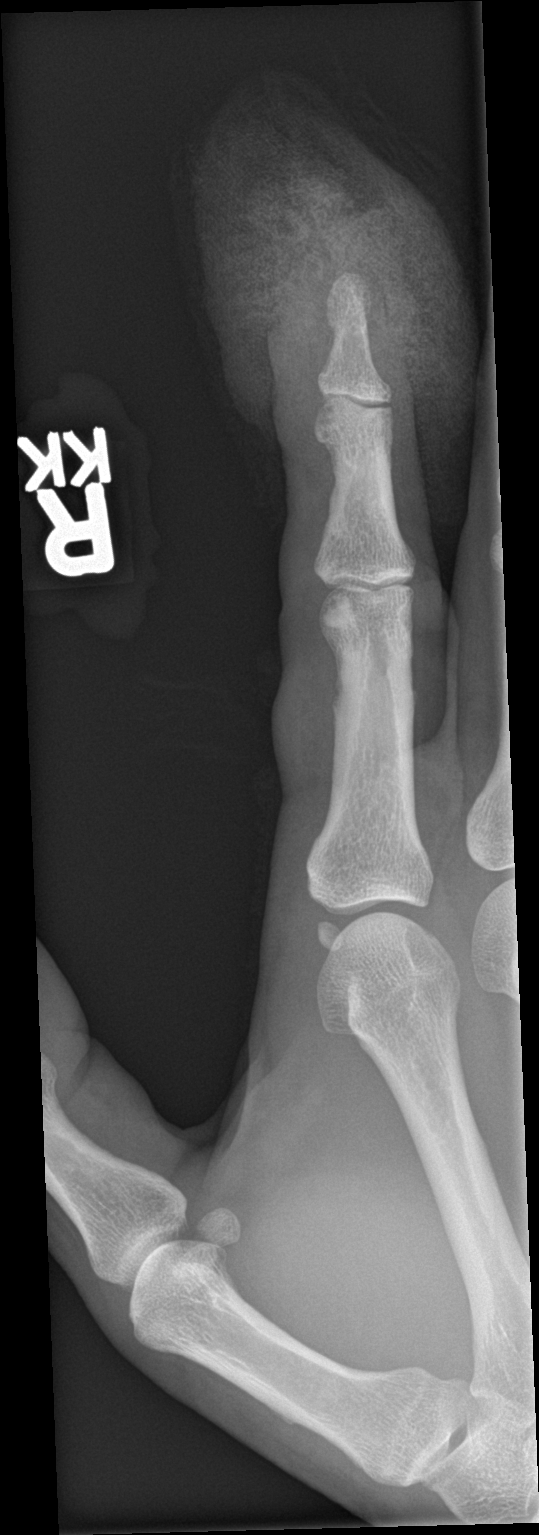

[finger lat]
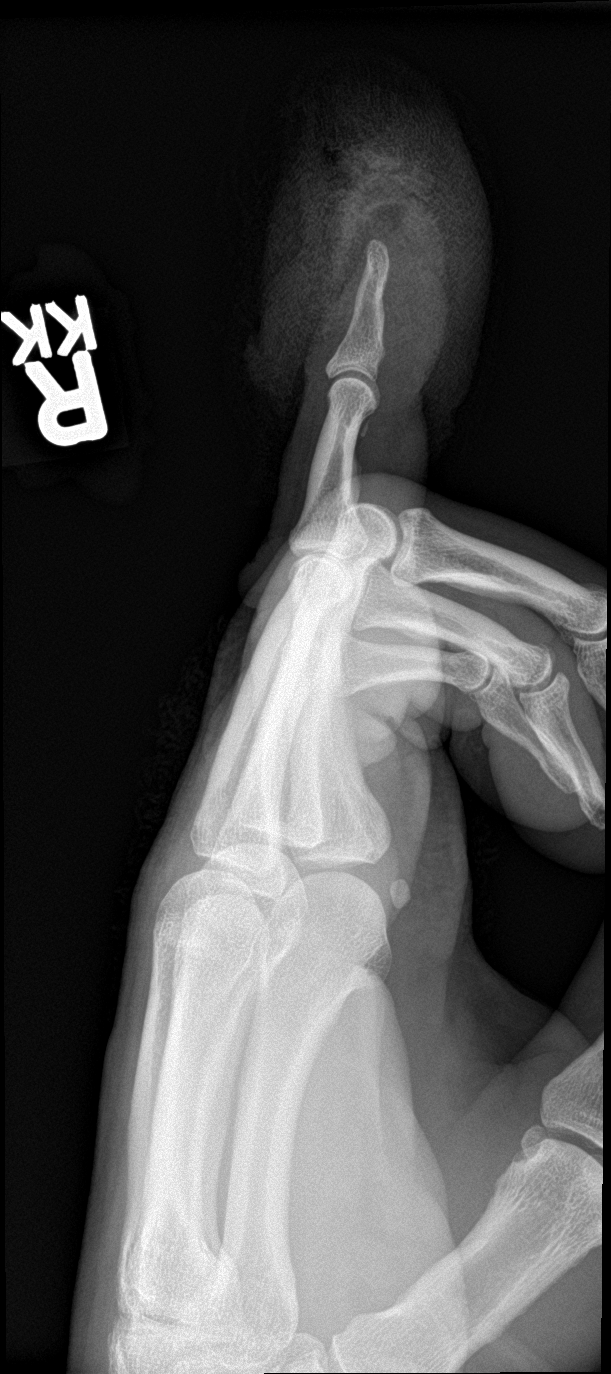

[3 of 3 positions shown; findings below may reference images not displayed]

FINDINGS: There is no evidence of fracture or dislocation. There is no
evidence of arthropathy or other focal bone abnormality. Large soft
tissue laceration and hematoma seen in the tuft of the right index
finger.
IMPRESSION: 1. No acute fracture or dislocation identified about the right index
finger.
2. Large soft tissue laceration and hematoma.

## 2023-08-26 DIAGNOSIS — R945 Abnormal results of liver function studies: Secondary | ICD-10-CM | POA: Diagnosis not present

## 2023-08-26 DIAGNOSIS — K5140 Inflammatory polyps of colon without complications: Secondary | ICD-10-CM | POA: Diagnosis not present

## 2023-08-26 DIAGNOSIS — K529 Noninfective gastroenteritis and colitis, unspecified: Secondary | ICD-10-CM | POA: Diagnosis not present

## 2023-08-26 DIAGNOSIS — Z1211 Encounter for screening for malignant neoplasm of colon: Secondary | ICD-10-CM | POA: Diagnosis not present

## 2023-09-08 DIAGNOSIS — M47816 Spondylosis without myelopathy or radiculopathy, lumbar region: Secondary | ICD-10-CM | POA: Diagnosis not present

## 2023-09-08 DIAGNOSIS — M48061 Spinal stenosis, lumbar region without neurogenic claudication: Secondary | ICD-10-CM | POA: Diagnosis not present

## 2023-10-07 DIAGNOSIS — K51819 Other ulcerative colitis with unspecified complications: Secondary | ICD-10-CM | POA: Diagnosis not present

## 2023-10-07 DIAGNOSIS — R898 Other abnormal findings in specimens from other organs, systems and tissues: Secondary | ICD-10-CM | POA: Diagnosis not present

## 2023-10-07 DIAGNOSIS — K639 Disease of intestine, unspecified: Secondary | ICD-10-CM | POA: Diagnosis not present

## 2023-10-07 DIAGNOSIS — I2510 Atherosclerotic heart disease of native coronary artery without angina pectoris: Secondary | ICD-10-CM | POA: Diagnosis not present
